# Patient Record
Sex: Female | Born: 1975 | Race: White | Hispanic: No
Health system: Southern US, Community
[De-identification: ages and names within clinical notes are randomized; demographics above are authoritative.]

## PROBLEM LIST (undated history)

## (undated) ENCOUNTER — Emergency Department: Admission: EM | Payer: Self-pay | Source: Home / Self Care

---

## 2012-09-30 ENCOUNTER — Emergency Department: Payer: Self-pay | Admitting: Emergency Medicine

## 2012-09-30 LAB — DRUG SCREEN, URINE
Barbiturates, Ur Screen: NEGATIVE (ref ?–200)
Cannabinoid 50 Ng, Ur ~~LOC~~: POSITIVE (ref ?–50)
Cocaine Metabolite,Ur ~~LOC~~: NEGATIVE (ref ?–300)
MDMA (Ecstasy)Ur Screen: NEGATIVE (ref ?–500)
Methadone, Ur Screen: NEGATIVE (ref ?–300)

## 2012-09-30 LAB — URINALYSIS, COMPLETE
Blood: NEGATIVE
Nitrite: NEGATIVE
Ph: 6 (ref 4.5–8.0)
Protein: 150
Specific Gravity: 1.02 (ref 1.003–1.030)
Squamous Epithelial: 8
WBC UR: 10 /HPF (ref 0–5)

## 2012-09-30 LAB — BASIC METABOLIC PANEL
Anion Gap: 10 (ref 7–16)
BUN: 16 mg/dL (ref 7–18)
Chloride: 105 mmol/L (ref 98–107)
Co2: 22 mmol/L (ref 21–32)
EGFR (African American): >60
Osmolality: 277 (ref 275–301)
Potassium: 3.2 mmol/L — ABNORMAL LOW (ref 3.5–5.1)
Sodium: 137 mmol/L (ref 136–145)

## 2012-09-30 LAB — CBC WITH DIFFERENTIAL/PLATELET
Basophil #: 0.2 10*3/uL — ABNORMAL HIGH (ref 0.0–0.1)
Eosinophil #: 0.3 10*3/uL (ref 0.0–0.7)
Eosinophil %: 2.5 %
HGB: 13.7 g/dL (ref 12.0–16.0)
Lymphocyte #: 3.8 10*3/uL — ABNORMAL HIGH (ref 1.0–3.6)
Lymphocyte %: 29.5 %
MCH: 31.8 pg (ref 26.0–34.0)
MCHC: 34.5 g/dL (ref 32.0–36.0)
MCV: 92 fL (ref 80–100)
Monocyte #: 0.9 x10 3/mm (ref 0.2–0.9)
RBC: 4.33 10*6/uL (ref 3.80–5.20)

## 2012-09-30 LAB — ETHANOL
Ethanol %: <0.003 % (ref 0.000–0.080)
Ethanol: <3 mg/dL

## 2013-05-22 ENCOUNTER — Emergency Department: Payer: Self-pay | Admitting: Emergency Medicine

## 2013-12-12 ENCOUNTER — Emergency Department: Payer: Self-pay | Admitting: Internal Medicine

## 2014-01-28 IMAGING — CT CT HEAD WITHOUT CONTRAST
1 series · 16 of 30 positions shown, 20 images · non-contrast
Comparison: none

REASON FOR EXAM: new onset seizure
COMMENTS:

PROCEDURE:     CT  - CT HEAD WITHOUT CONTRAST  - September 30, 2012  [DATE]
RESULT:     Comparison:  None
TECHNIQUE: Multiple axial images from the foramen magnum to the vertex were
obtained without IV contrast.

[Series 2: soft tissue · axial · 0.44mm/px · z∈[-23,+112]mm · 16 of 30 slices shown, 20 images]
[im 2/30  brain]
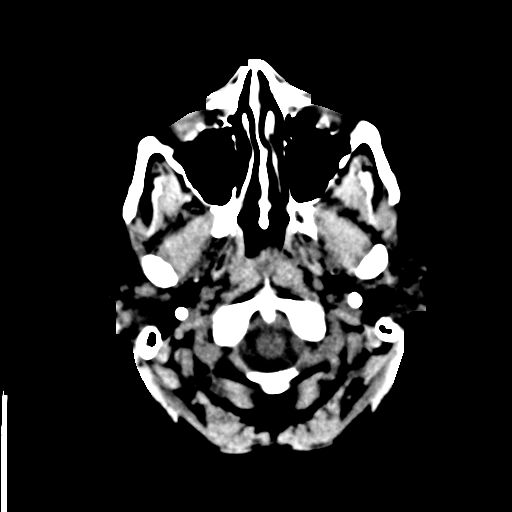
[im 2/30  bone]
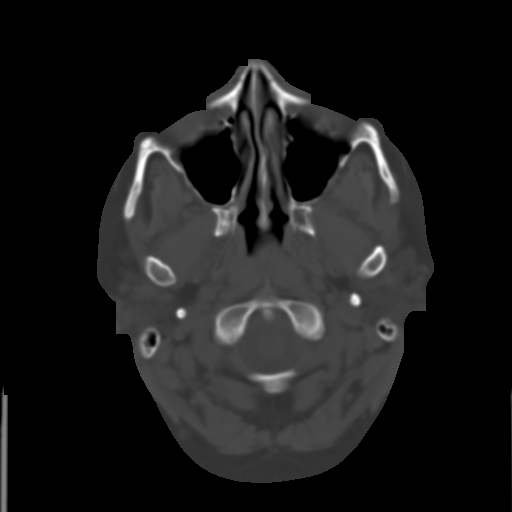
[im 4/30  brain]
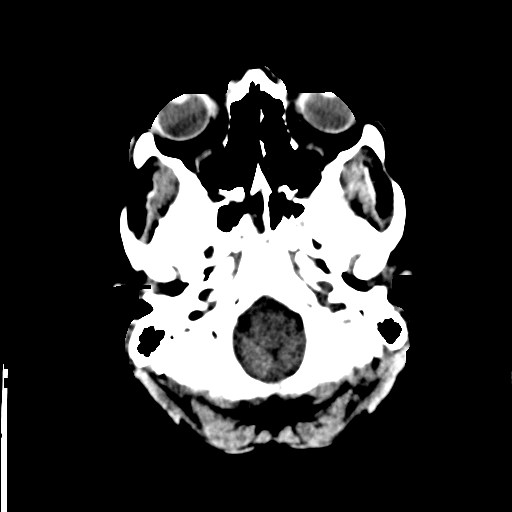
[im 6/30  brain]
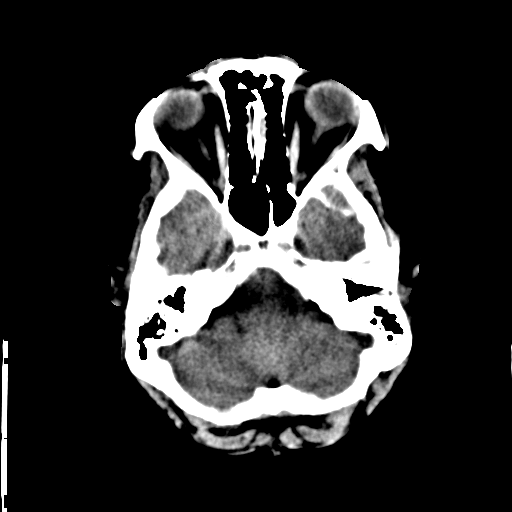
[im 8/30  brain]
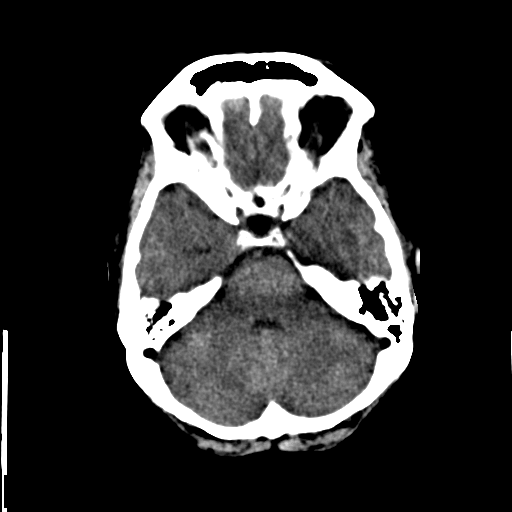
[im 9/30  brain]
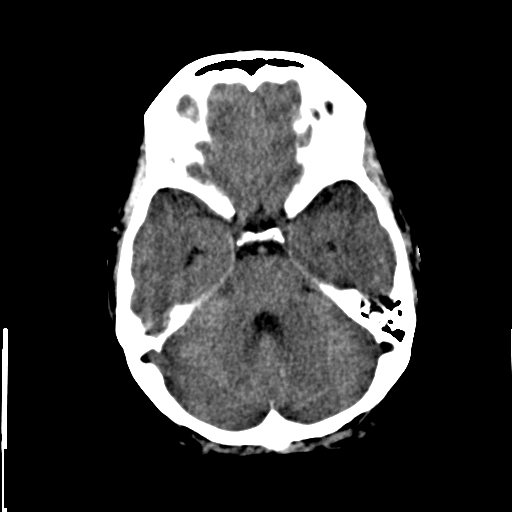
[im 9/30  bone]
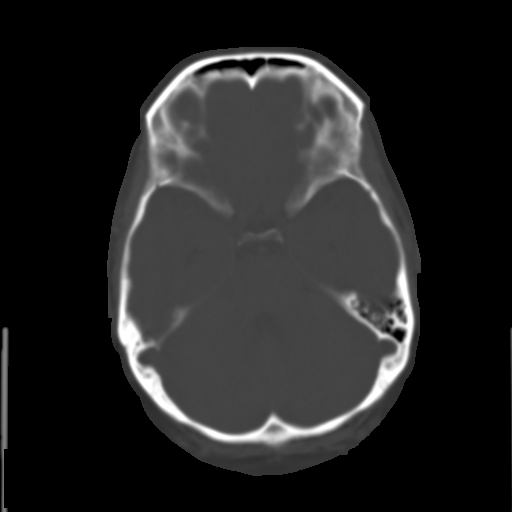
[im 11/30  brain]
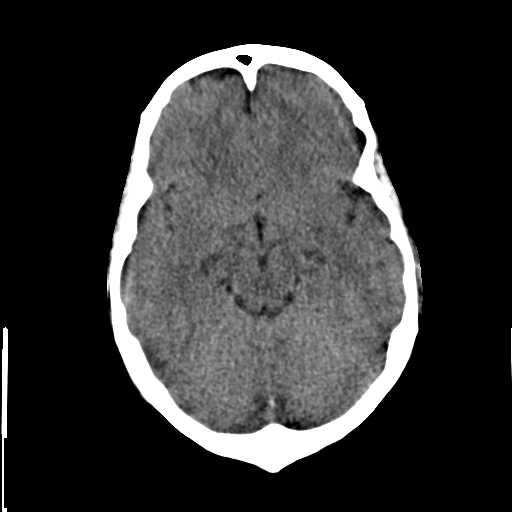
[im 13/30  brain]
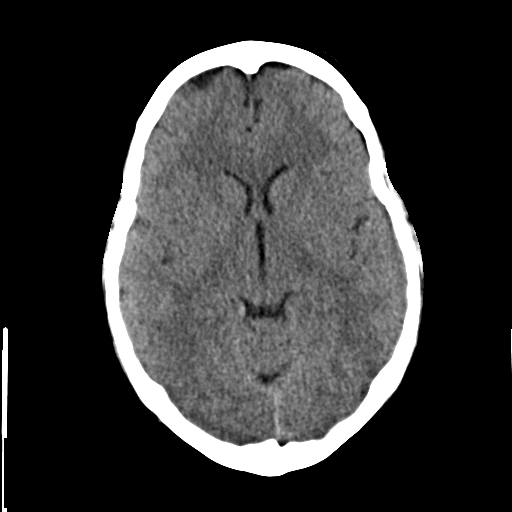
[im 15/30  brain]
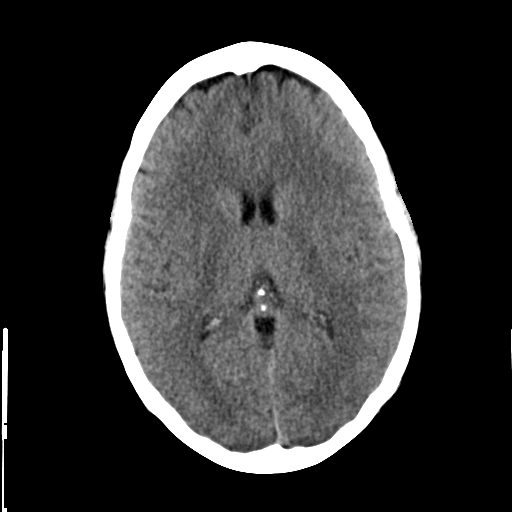
[im 16/30  brain]
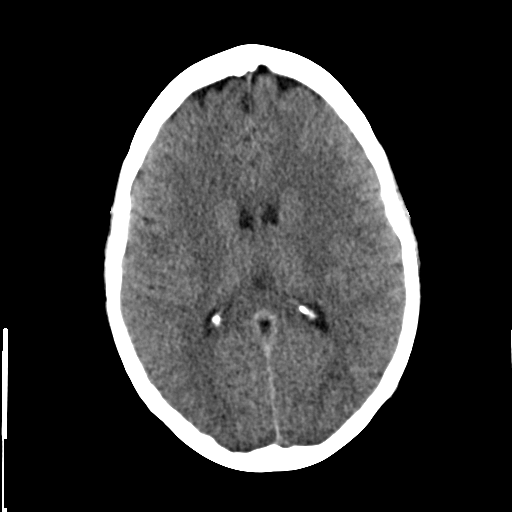
[im 16/30  bone]
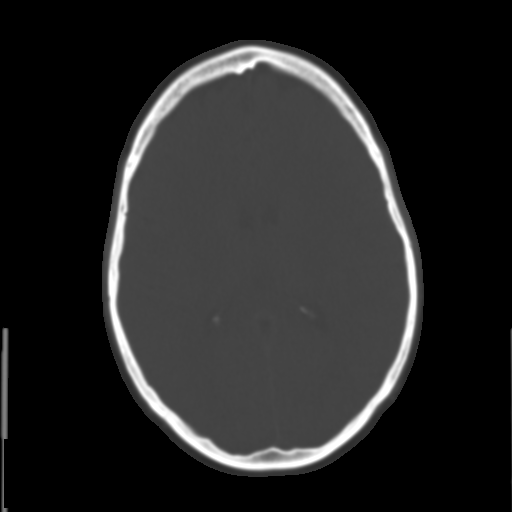
[im 18/30  brain]
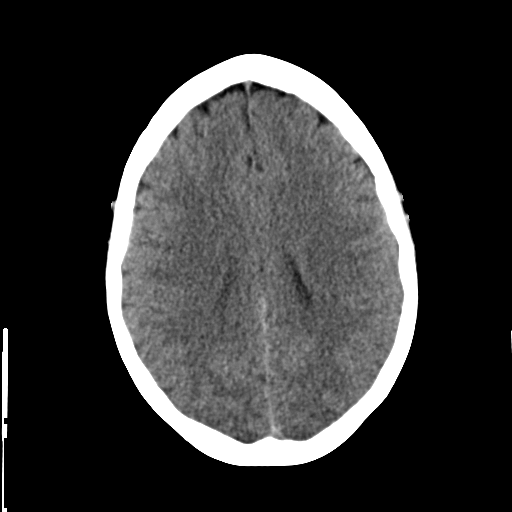
[im 20/30  brain]
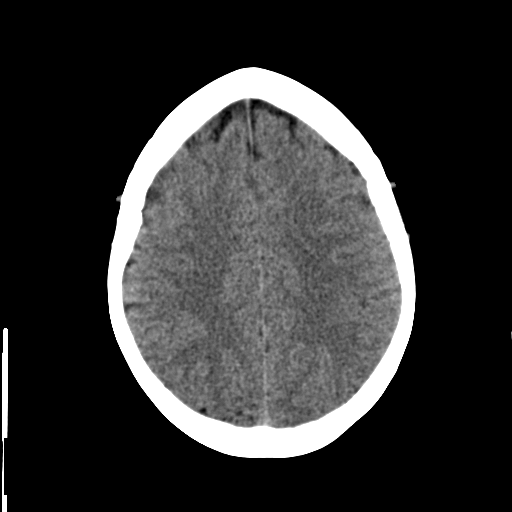
[im 22/30  brain]
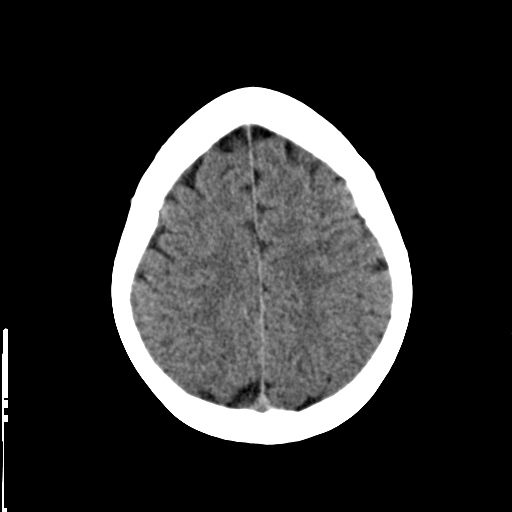
[im 23/30  brain]
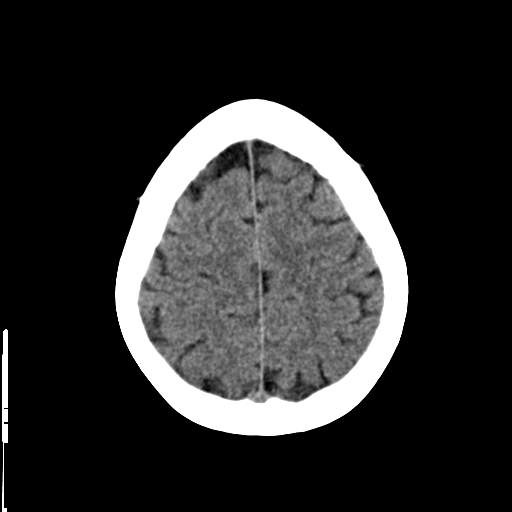
[im 23/30  bone]
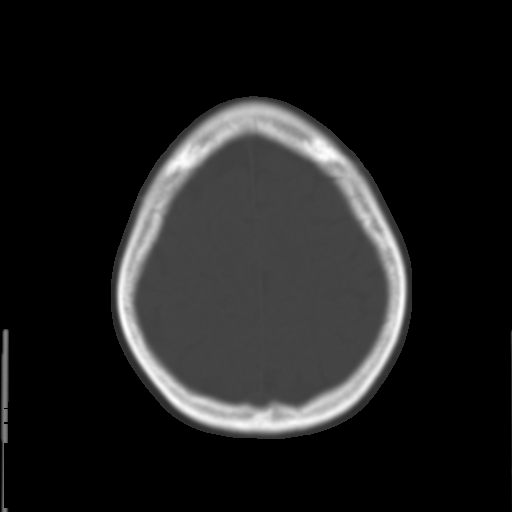
[im 25/30  brain]
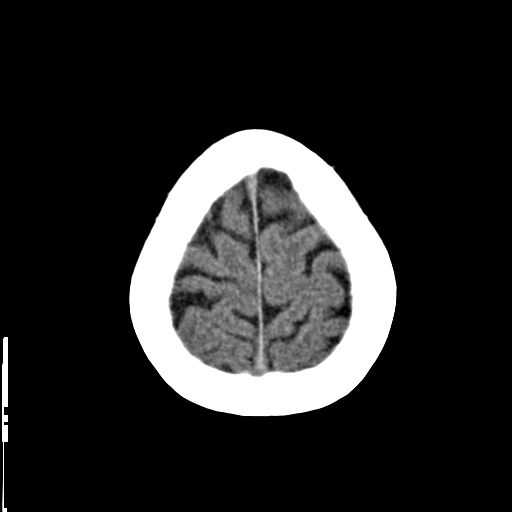
[im 27/30  brain]
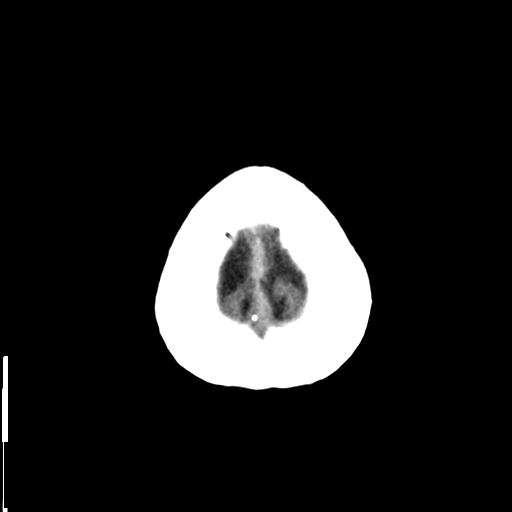
[im 29/30  brain]
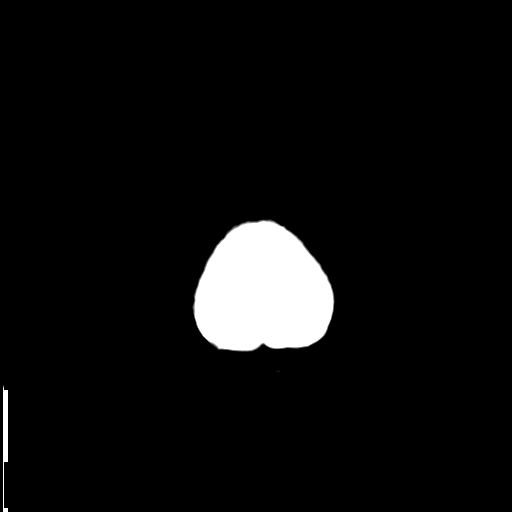

[16 of 30 positions shown; findings below may reference images not displayed]

FINDINGS: There is no evidence of mass effect, midline shift, or extra-axial fluid
collections.  There is no evidence of a space-occupying lesion or
intracranial hemorrhage. There is no evidence of a cortical-based area of
acute infarction.

The ventricles and sulci are appropriate for the patient's age. The basal
cisterns are patent.

Visualized portions of the orbits are unremarkable. The visualized portions
of the paranasal sinuses and mastoid air cells are unremarkable.

The osseous structures are unremarkable.
IMPRESSION: No acute intracranial process.

[REDACTED]

## 2015-02-23 IMAGING — CR DG CHEST 2V
1 series · 2 of 2 positions shown · non-contrast
Comparison: None.

CLINICAL DATA: Cough and wheezing, fever, smoker

EXAM:
CHEST  2 VIEW

[Series 1: w chest pa · 0.14mm/px · 2 of 2 slices shown]
[im 1/2]
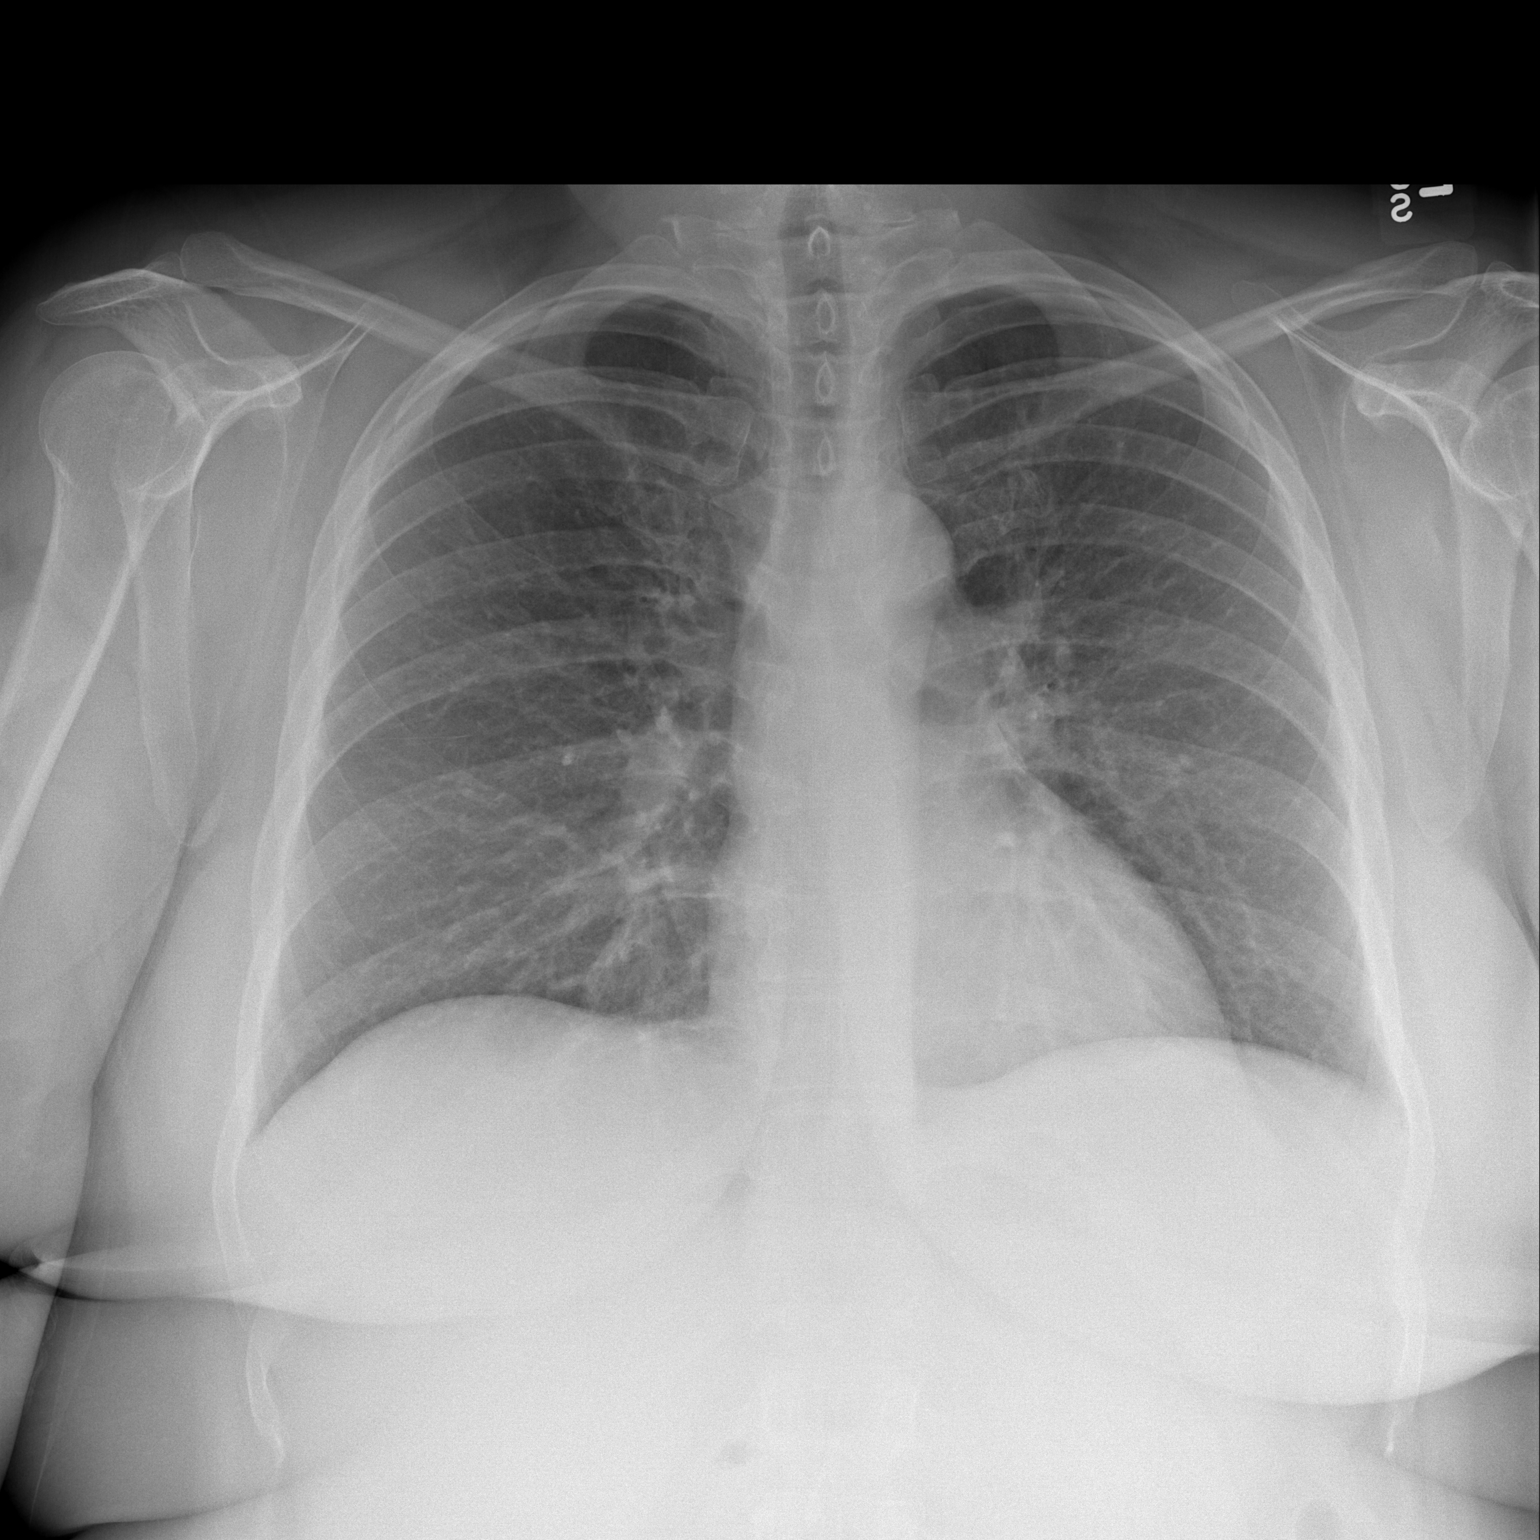
[im 2/2]
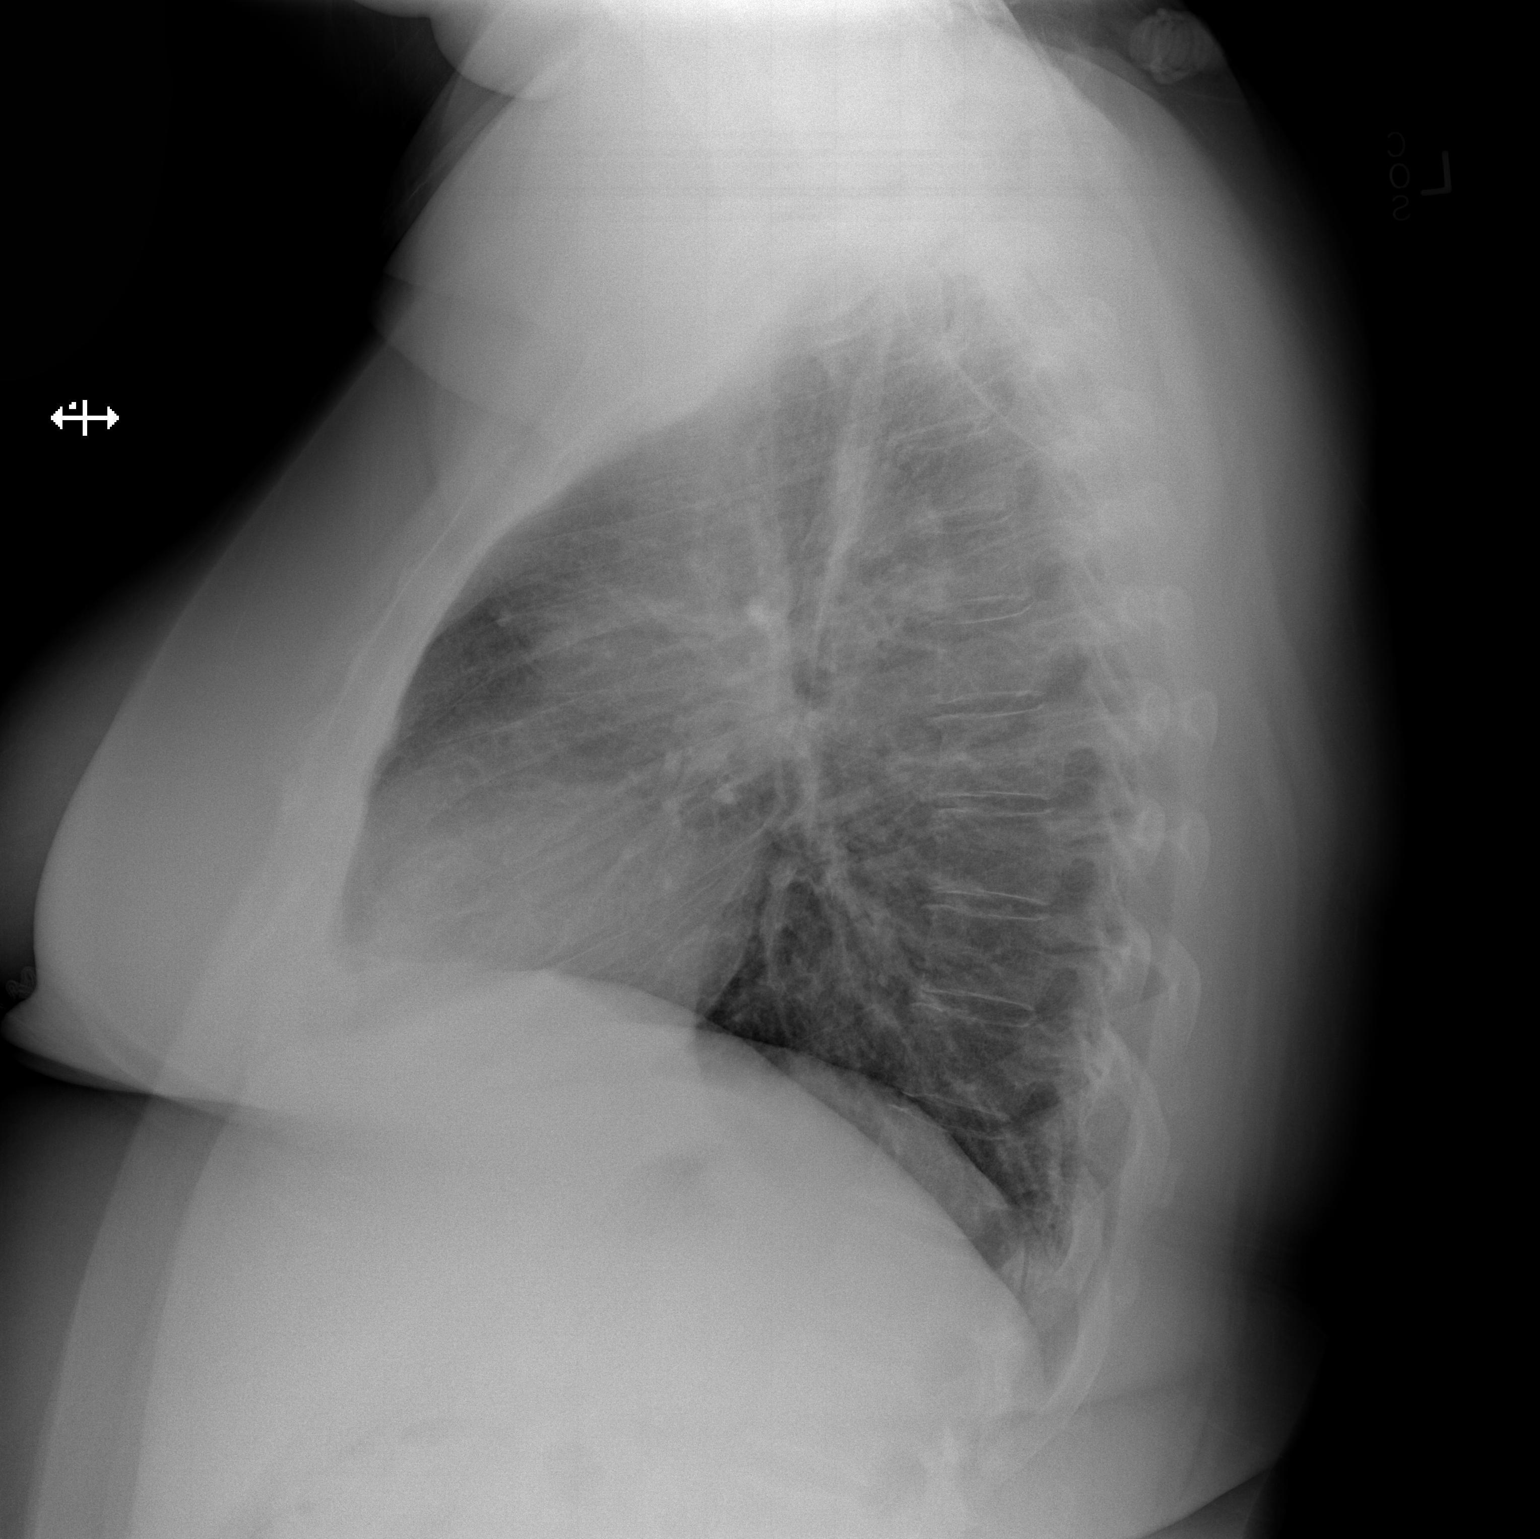

[2 of 2 positions shown; findings below may reference images not displayed]

FINDINGS: The heart size and mediastinal contours are within normal limits.
Both lungs are clear. The visualized skeletal structures are
unremarkable. Minor central bronchitic change noted. Trachea is
midline. No effusion or pneumothorax.
IMPRESSION: Minor bronchitic change.  No superimposed acute process

## 2017-09-21 ENCOUNTER — Other Ambulatory Visit
Admission: AD | Admit: 2017-09-21 | Discharge: 2017-09-21 | Disposition: A | Discharge status: Home / Self Care | Payer: No Typology Code available for payment source | Attending: Family Medicine | Admitting: Family Medicine

## 2022-02-10 DIAGNOSIS — L918 Other hypertrophic disorders of the skin: Secondary | ICD-10-CM | POA: Diagnosis not present

## 2022-02-10 DIAGNOSIS — J4521 Mild intermittent asthma with (acute) exacerbation: Secondary | ICD-10-CM | POA: Diagnosis not present

## 2022-02-10 DIAGNOSIS — Z1389 Encounter for screening for other disorder: Secondary | ICD-10-CM | POA: Diagnosis not present

## 2022-02-10 DIAGNOSIS — Z0000 Encounter for general adult medical examination without abnormal findings: Secondary | ICD-10-CM | POA: Diagnosis not present

## 2022-02-10 DIAGNOSIS — R7309 Other abnormal glucose: Secondary | ICD-10-CM | POA: Diagnosis not present

## 2022-02-10 DIAGNOSIS — F330 Major depressive disorder, recurrent, mild: Secondary | ICD-10-CM | POA: Diagnosis not present

## 2022-02-10 DIAGNOSIS — G4700 Insomnia, unspecified: Secondary | ICD-10-CM | POA: Diagnosis not present

## 2022-02-10 DIAGNOSIS — Z131 Encounter for screening for diabetes mellitus: Secondary | ICD-10-CM | POA: Diagnosis not present

## 2022-02-10 DIAGNOSIS — Z1331 Encounter for screening for depression: Secondary | ICD-10-CM | POA: Diagnosis not present

## 2022-02-10 DIAGNOSIS — Z13220 Encounter for screening for lipoid disorders: Secondary | ICD-10-CM | POA: Diagnosis not present

## 2022-02-16 DIAGNOSIS — J4521 Mild intermittent asthma with (acute) exacerbation: Secondary | ICD-10-CM | POA: Diagnosis not present

## 2022-02-16 DIAGNOSIS — R059 Cough, unspecified: Secondary | ICD-10-CM | POA: Diagnosis not present

## 2022-02-22 DIAGNOSIS — J4521 Mild intermittent asthma with (acute) exacerbation: Secondary | ICD-10-CM | POA: Diagnosis not present

## 2022-02-22 DIAGNOSIS — M545 Low back pain, unspecified: Secondary | ICD-10-CM | POA: Diagnosis not present

## 2022-02-22 DIAGNOSIS — R059 Cough, unspecified: Secondary | ICD-10-CM | POA: Diagnosis not present

## 2022-02-28 DIAGNOSIS — D485 Neoplasm of uncertain behavior of skin: Secondary | ICD-10-CM | POA: Diagnosis not present

## 2022-02-28 DIAGNOSIS — B079 Viral wart, unspecified: Secondary | ICD-10-CM | POA: Diagnosis not present

## 2022-02-28 DIAGNOSIS — Z1389 Encounter for screening for other disorder: Secondary | ICD-10-CM | POA: Diagnosis not present

## 2022-02-28 DIAGNOSIS — R238 Other skin changes: Secondary | ICD-10-CM | POA: Diagnosis not present

## 2022-03-01 DIAGNOSIS — J45909 Unspecified asthma, uncomplicated: Secondary | ICD-10-CM | POA: Diagnosis not present

## 2022-03-01 DIAGNOSIS — J4521 Mild intermittent asthma with (acute) exacerbation: Secondary | ICD-10-CM | POA: Diagnosis not present

## 2022-03-01 DIAGNOSIS — R059 Cough, unspecified: Secondary | ICD-10-CM | POA: Diagnosis not present

## 2022-03-04 DIAGNOSIS — G43009 Migraine without aura, not intractable, without status migrainosus: Secondary | ICD-10-CM | POA: Diagnosis not present

## 2022-03-04 DIAGNOSIS — E119 Type 2 diabetes mellitus without complications: Secondary | ICD-10-CM | POA: Diagnosis not present

## 2022-03-04 DIAGNOSIS — J4520 Mild intermittent asthma, uncomplicated: Secondary | ICD-10-CM | POA: Diagnosis not present

## 2022-03-04 DIAGNOSIS — R600 Localized edema: Secondary | ICD-10-CM | POA: Diagnosis not present

## 2022-03-04 DIAGNOSIS — M545 Low back pain, unspecified: Secondary | ICD-10-CM | POA: Diagnosis not present

## 2022-03-04 DIAGNOSIS — F321 Major depressive disorder, single episode, moderate: Secondary | ICD-10-CM | POA: Diagnosis not present

## 2022-03-04 DIAGNOSIS — N6313 Unspecified lump in the right breast, lower outer quadrant: Secondary | ICD-10-CM | POA: Diagnosis not present

## 2022-03-04 DIAGNOSIS — Z23 Encounter for immunization: Secondary | ICD-10-CM | POA: Diagnosis not present

## 2022-03-04 DIAGNOSIS — Z131 Encounter for screening for diabetes mellitus: Secondary | ICD-10-CM | POA: Diagnosis not present

## 2022-03-04 DIAGNOSIS — Z1159 Encounter for screening for other viral diseases: Secondary | ICD-10-CM | POA: Diagnosis not present

## 2022-03-08 DIAGNOSIS — R600 Localized edema: Secondary | ICD-10-CM | POA: Diagnosis not present

## 2022-03-16 DIAGNOSIS — Z124 Encounter for screening for malignant neoplasm of cervix: Secondary | ICD-10-CM | POA: Diagnosis not present

## 2022-03-18 DIAGNOSIS — N6313 Unspecified lump in the right breast, lower outer quadrant: Secondary | ICD-10-CM | POA: Diagnosis not present

## 2022-03-24 DIAGNOSIS — M5416 Radiculopathy, lumbar region: Secondary | ICD-10-CM | POA: Diagnosis not present

## 2022-03-24 DIAGNOSIS — Z6841 Body Mass Index (BMI) 40.0 and over, adult: Secondary | ICD-10-CM | POA: Diagnosis not present

## 2022-04-11 DIAGNOSIS — E782 Mixed hyperlipidemia: Secondary | ICD-10-CM | POA: Diagnosis not present

## 2022-04-11 DIAGNOSIS — M545 Low back pain, unspecified: Secondary | ICD-10-CM | POA: Diagnosis not present

## 2022-04-11 DIAGNOSIS — F419 Anxiety disorder, unspecified: Secondary | ICD-10-CM | POA: Diagnosis not present

## 2022-04-11 DIAGNOSIS — F332 Major depressive disorder, recurrent severe without psychotic features: Secondary | ICD-10-CM | POA: Diagnosis not present

## 2022-04-11 DIAGNOSIS — G43009 Migraine without aura, not intractable, without status migrainosus: Secondary | ICD-10-CM | POA: Diagnosis not present

## 2022-04-11 DIAGNOSIS — N6313 Unspecified lump in the right breast, lower outer quadrant: Secondary | ICD-10-CM | POA: Diagnosis not present

## 2022-04-11 DIAGNOSIS — E119 Type 2 diabetes mellitus without complications: Secondary | ICD-10-CM | POA: Diagnosis not present

## 2022-04-11 DIAGNOSIS — R600 Localized edema: Secondary | ICD-10-CM | POA: Diagnosis not present

## 2022-05-18 NOTE — Therapy (Signed)
OUTPATIENT PHYSICAL THERAPY THORACOLUMBAR EVALUATION   Patient Name: ALESCIA ROWBOTHAM MRN: [id number] DOB:22-Jul-1975, 47 y.o., female Today's Date: 05/18/2022  END OF SESSION:   No past medical history on file. *** The histories are not reviewed yet. Please review them in the "History" navigator section and refresh this SmartLink. There are no problems to display for this patient.  PCP: Patient, No Pcp Per  REFERRING PROVIDER: Sherryl Manges*  REFERRING DIAG: M54.16 (ICD-10-CM) - Radiculopathy, lumbar region  RATIONALE FOR EVALUATION AND TREATMENT: Rehabilitation  THERAPY DIAG: Other low back pain  ONSET DATE: ***  FOLLOW-UP APPT SCHEDULED WITH REFERRING PROVIDER: {yes/no:20286}    SUBJECTIVE:                                                                                                                                                                                         SUBJECTIVE STATEMENT:  ***  PERTINENT HISTORY:  47 year old patient comes in today discuss her back pain and left leg pain that started about 3 years ago. It has progressively gotten worse. She did see a physician at [facility] who ordered x-rays an MRI. She was told she was too obese to have surgery. She cannot sit or stand for too long. She denies any numbness or tingling PE. The pain radiates down the posterior aspect of her left leg. She has tried physical therapy a couple years ago which did not help with her pain. Denies any change in her bowel or bladder habits. Rates her pain today an 8/10, constant, achy pretty  PAIN:    Pain Intensity: Present: /10, Best: /10, Worst: /10 Pain location: *** Pain Quality: {PAIN DESCRIPTION:21022940}  Radiating: {yes/no:20286}  Numbness/Tingling: {yes/no:20286} Focal Weakness: {yes/no:20286} Aggravating factors: *** Relieving factors: *** 24-hour pain behavior: *** How long can you sit: How long can you stand: History of prior back injury, pain,  surgery, or therapy: {yes/no:20286} Dominant hand: {RIGHT/LEFT:20294} Imaging: {yes/no:20286}  Red flags: Negative for bowel/bladder changes, saddle paresthesia, personal history of cancer, h/o spinal tumors, h/o compression fx, h/o abdominal aneurysm, abdominal pain, chills/fever, night sweats, nausea, vomiting, unrelenting pain, first onset of insidious LBP <20 y/o  PRECAUTIONS: {Therapy precautions:24002}  WEIGHT BEARING RESTRICTIONS: {Yes ***/No:24003}  FALLS: Has patient fallen in last 6 months? {fallsyesno:27318}  Living Environment Lives with: {[facility] lives with:25569::"lives with their family"} Lives in: {Lives in:25570} Stairs: {opstairs:27293} Has following equipment at home: {Assistive devices:23999}  Prior level of function: {PLOF:24004}  Occupational demands:   Hobbies:   Patient Goals: ***   OBJECTIVE:  Patient Surveys  {rehab surveys:24030}  Cognition Patient is oriented to person, place, and time.  Recent memory is intact.  Remote memory is intact.  Attention span and concentration are intact.  Expressive speech is intact.  Patient's fund of knowledge is within normal limits for educational level.    Gross Musculoskeletal Assessment Tremor: None Bulk: Normal Tone: Normal No visible step-off along spinal column, no signs of scoliosis  GAIT: Distance walked: *** Assistive device utilized: {Assistive devices:23999} Level of assistance: {Levels of assistance:24026} Comments: ***  Posture: Lumbar lordosis: WNL Iliac crest height: Equal bilaterally Lumbar lateral shift: Negative  AROM AROM (Normal range in degrees) AROM   Lumbar   Flexion (65)   Extension (30)   Right lateral flexion (25)   Left lateral flexion (25)   Right rotation (30)   Left rotation (30)       Hip Right Left  Flexion (125)    Extension (15)    Abduction (40)    Adduction     Internal Rotation (45)    External Rotation (45)        Knee    Flexion (135)     Extension (0)        Ankle    Dorsiflexion (20)    Plantarflexion (50)    Inversion (35)    Eversion (15)    (* = pain; Blank rows = not tested)  LE MMT: MMT (out of 5) Right  Left   Hip flexion    Hip extension    Hip abduction    Hip adduction    Hip internal rotation    Hip external rotation    Knee flexion    Knee extension    Ankle dorsiflexion    Ankle plantarflexion    Ankle inversion    Ankle eversion    (* = pain; Blank rows = not tested)  Sensation Grossly intact to light touch throughout bilateral LEs as determined by testing dermatomes L2-S2. Proprioception, stereognosis, and hot/cold testing deferred on this date.  Reflexes R/L Knee Jerk (L3/4): 2+/2+  Ankle Jerk (S1/2): 2+/2+   Muscle Length Hamstrings: R: {NEGATIVE/POSITIVE ZOX:09604} L: {NEGATIVE/POSITIVE VWU:98119} Ely (quadriceps): R: {NEGATIVE/POSITIVE JYN:82956} L: {NEGATIVE/POSITIVE OZH:08657} Thomas (hip flexors): R: {NEGATIVE/POSITIVE QIO:96295} L: {NEGATIVE/POSITIVE MWU:13244} Ober: R: {NEGATIVE/POSITIVE WNU:27253} L: {NEGATIVE/POSITIVE GUY:40347}  Palpation Location Right Left         Lumbar paraspinals    Quadratus Lumborum    Gluteus Maximus    Gluteus Medius    Deep hip external rotators    PSIS    Fortin's Area (SIJ)    Greater Trochanter    (Blank rows = not tested) Graded on 0-4 scale (0 = no pain, 1 = pain, 2 = pain with wincing/grimacing/flinching, 3 = pain with withdrawal, 4 = unwilling to allow palpation)  Passive Accessory Intervertebral Motion Pt denies reproduction of back pain with CPA L1-L5 and UPA bilaterally L1-L5. Generally, hypomobile throughout  Special Tests Lumbar Radiculopathy and Discogenic: Centralization and Peripheralization (SN 92, -LR 0.12): {NEGATIVE/POSITIVE FOR:19998} Slump (SN 83, -LR 0.32): R: {NEGATIVE/POSITIVE FOR:19998} L: {NEGATIVE/POSITIVE FOR:19998} SLR (SN 92, -LR 0.29): R: {NEGATIVE/POSITIVE QQV:95638} L:  {NEGATIVE/POSITIVE  VFI:43329} Crossed SLR (SP 90): R: {NEGATIVE/POSITIVE JJO:84166} L: {NEGATIVE/POSITIVE AYT:01601}  Facet Joint: Extension-Rotation (SN 100, -LR 0.0): R: {NEGATIVE/POSITIVE UXN:23557} L: {NEGATIVE/POSITIVE DUK:02542}  Lumbar Foraminal Stenosis: Lumbar quadrant (SN 70): R: {NEGATIVE/POSITIVE HCW:23762} L: {NEGATIVE/POSITIVE GBT:51761}  Hip: FABER (SN 81): R: {NEGATIVE/POSITIVE YWV:37106} L: {NEGATIVE/POSITIVE YIR:48546} FADIR (SN 94): R: {NEGATIVE/POSITIVE EVO:35009} L: {NEGATIVE/POSITIVE FGH:82993} Hip scour (SN 50): R: {NEGATIVE/POSITIVE ZJI:96789} L: {NEGATIVE/POSITIVE FYB:01751}  SIJ:  Thigh Thrust (SN 88, -LR 0.18) :  R: {NEGATIVE/POSITIVE XLK:44010} L: {NEGATIVE/POSITIVE UVO:53664}  Piriformis Syndrome: FAIR Test (SN 88, SP 83): R: {NEGATIVE/POSITIVE QIH:47425} L: {NEGATIVE/POSITIVE ZDG:38756}  Functional Tasks Lifting: Deep squat: Sit to stand: Forward Step-Down Test: R:  L:  Lateral Step-Down Test: R:  L:   Beighton scale  LEFT  RIGHT           1. Passive dorsiflexion and hyperextension of the fifth MCP joint beyond 90  0 0   2. Passive apposition of the thumb to the flexor aspect of the forearm  0  0   3. Passive hyperextension of the elbow beyond 10  0  0   4. Passive hyperextension of the knee beyond 10  0  0   5. Active forward flexion of the trunk with the knees fully extended so that the palms of the hands rest flat on the floor   0   TOTAL         0/ 9    Clinical Prediction Rule for Compression Fractures: 1. >52 y/o 2. no presence of leg pain 3. BMI <22 4. client not regularly exercising 5. female <1 of 5 = SN 97, - LR 0.16 >4 of 5 = SP 96, + LR 9.6  Clinical Prediction Rule for Spinal Stenosis: 1. Bilateral symptoms 2. Leg pain > back pain 3. Pain during walking or standing 4. Pain relief when sitting 5. Age >61 y/o <1 (+) finding = rule out stenosis (SN 96) 4 of 5 (+) findings = rule in stenosis (SP 98)  Clinical Prediction Rule for Facet Joint  Syndrome (check pg 460): 1. Age > 14 y/o 2. Symptoms best when walking 3. Symptoms best when sitting 4. Onset of pain is paraspinal 5. (+) lumbar extension-rotation test >3 (+) findings = SP 91, +LR 9.7 <2 (+) findings = SN 100  Pain Provocation Cluster for SIJ Dysfunction Thigh Thrust Test (SN 88, -LR 0.18) Gaenslen's Test Distraction Test Compression Test Sacral Thrust Test  3 (+) tests: if discogenic pain has been ruled out through repeated extensions; SN 94, -LR 0.80  Rule out Hip OA (SN 86) Hip pain Hip IR <15 degrees morning stiffness < 60 min > 50 y/o  Neurogenic vs Vascular Claudication (Rieman, 2016)  Description Neurogenic Vascular  Quality of pain cramping Burning, cramping  LBP Frequently present Absent   Sensory symptoms Frequently present Absent  Muscle weakness Frequently present Absent  Reflex changes Frequently present Absent  Bicycle test Symptoms only when sitting upright Symptoms not position dependent  Arterial pulses Normal  Decreased or absent  Skin/dystrophic changes Absent  Frequently present  Aggravating factors Upright posture, extension of trunk Any leg activity  Relieving factors Sitting, bending forward Rest, no leg activity  Walking uphill Symptoms produced later Not position dependent  Walking downhill Symptoms produced earlier Not position dependent   Lumbar Spinal Stenosis vs Radiculopathy due to Disc Herniation (Rieman, 2016)  Description Spinal Stenosis Disc Herniation  Age Usually > 75 y/o Usually < 21 y/o  Onset Usually more insidious Usually more sudden  Position change: flexion Better  Worse  Position change: extension Worse Better  Focal muscle weakness Less common Can be common  Dural tension Less common Common  UMN or LMN involvement  Central stenosis: UMN Lateral foraminal stenosis: LMN LMN     TODAY'S TREATMENT: DATE: ***     PATIENT EDUCATION:  Education details: *** Person educated: {Person  educated:25204} Education method: {Education Method:25205} Education comprehension: {Education Comprehension:25206}   HOME EXERCISE PROGRAM:  ASSESSMENT:  CLINICAL IMPRESSION: Patient is a *** y.o. *** who was seen today for physical therapy evaluation and treatment for ***.   OBJECTIVE IMPAIRMENTS: {opptimpairments:25111}.   ACTIVITY LIMITATIONS: {activitylimitations:27494}  PARTICIPATION LIMITATIONS: {participationrestrictions:25113}  PERSONAL FACTORS: {Personal factors:25162} are also affecting patient's functional outcome.   REHAB POTENTIAL: {rehabpotential:25112}  CLINICAL DECISION MAKING: {clinical decision making:25114}  EVALUATION COMPLEXITY: {Evaluation complexity:25115}   GOALS: Goals reviewed with patient? {yes/no:20286}  SHORT TERM GOALS: Target date: {follow up:25551}  Pt will be independent with HEP in order to improve strength and decrease back pain to improve pain-free function at home and work. Baseline: *** Goal status: INITIAL   LONG TERM GOALS: Target date: {follow up:25551}  Pt will increase FOTO to at least *** to demonstrate significant improvement in function at home and work related to back pain  Baseline:  Goal status: INITIAL  2.  Pt will decrease worst back pain by at least 2 points on the NPRS in order to demonstrate clinically significant reduction in back pain. Baseline: *** Goal status: INITIAL  3.  Pt will decrease mODI score by at least 13 points in order demonstrate clinically significant reduction in back pain/disability.       Baseline: *** Goal status: INITIAL  4.  *** Baseline: *** Goal status: INITIAL   PLAN: PT FREQUENCY: 1-2x/week  PT DURATION: {rehab duration:25117}  PLANNED INTERVENTIONS: Therapeutic exercises, Therapeutic activity, Neuromuscular re-education, Balance training, Gait training, Patient/Family education, Self Care, Joint mobilization, Joint manipulation, Vestibular training, Canalith  repositioning, Orthotic/Fit training, DME instructions, Dry Needling, Electrical stimulation, Spinal manipulation, Spinal mobilization, Cryotherapy, Moist heat, Taping, Traction, Ultrasound, Ionotophoresis 4mg /ml Dexamethasone, Manual therapy, and Re-evaluation.  PLAN FOR NEXT SESSION: ***   Sharalyn Ink Chelle Cayton PT, DPT, GCS  Carrol Hougland, PT 05/18/2022, 9:53 PM

## 2022-05-19 ENCOUNTER — Telehealth: Payer: Self-pay

## 2022-05-19 ENCOUNTER — Ambulatory Visit: Payer: Medicaid Other | Attending: Student

## 2022-05-19 DIAGNOSIS — M5442 Lumbago with sciatica, left side: Secondary | ICD-10-CM | POA: Insufficient documentation

## 2022-05-19 DIAGNOSIS — G8929 Other chronic pain: Secondary | ICD-10-CM | POA: Diagnosis not present

## 2022-05-19 DIAGNOSIS — M5459 Other low back pain: Secondary | ICD-10-CM

## 2022-05-19 NOTE — Telephone Encounter (Signed)
..   Medicaid Managed Care   Unsuccessful Outreach Note  05/19/2022 Name: Andrea Rocha MRN: [id number] DOB: 1975-01-19  Referred by: Patient, No Pcp Per Reason for referral : Appointment   An unsuccessful telephone outreach was attempted today. The patient was referred to the case management team for assistance with care management and care coordination.   Follow Up Plan: A HIPAA compliant phone message was left for the patient providing contact information and requesting a return call.  The care management team will reach out to the patient again over the next 7-14 days.    Weston Settle Care Guide  [facility] Managed  Care Guide [facility]  [phone]

## 2022-05-24 NOTE — Therapy (Signed)
OUTPATIENT PHYSICAL THERAPY LUMBAR TREATMENT   Andrea Rocha Name: Andrea Rocha MRN: [id number] DOB:Nov 02, 1975, 47 y.o., female Today's Date: 05/25/2022  END OF SESSION:  Andrea Rocha End of Session - 05/25/22 0845     Visit Number 2    Number of Visits 17    Date for Andrea Rocha Re-Evaluation 07/14/22    Authorization Type eval: 05/19/22    Andrea Rocha Start Time 0845    Andrea Rocha Stop Time 0930    Andrea Rocha Time Calculation (min) 45 min    Activity Tolerance Andrea Rocha tolerated treatment well    Behavior During Therapy [facility] for tasks assessed/performed            History reviewed. No pertinent past medical history. History reviewed. No pertinent surgical history. There are no problems to display for this Andrea Rocha.  PCP: Andrea Rocha  REFERRING PROVIDER: Sherryl Manges*  REFERRING DIAG: M54.16 (ICD-10-CM) - Radiculopathy, lumbar region  RATIONALE FOR EVALUATION AND TREATMENT: Rehabilitation  THERAPY DIAG: Chronic left-sided low back pain with left-sided sciatica  ONSET DATE: 05/19/2019 (approximate);  FOLLOW-UP APPT SCHEDULED WITH REFERRING PROVIDER: Yes   FROM INITIAL EVALUATION SUBJECTIVE:                                                                                                                                                                                         SUBJECTIVE STATEMENT:  L low back pain  PERTINENT HISTORY:  Andrea Rocha is a 47 year old female referred to physical therapy for left low back pain and with LLE radiculopathy which started about 3 years ago and has progressively worsened. She did see a physician at [facility] who ordered x-rays an MRI. Rocha Andrea Rocha MRI showed spinal spinal stenosis and disc bulge however images/report are not available to review at this time. She was told she was too obese to fit on the surgical frame and was not a surgical candidate. She was unhappy with the doctor and transferred to [facility] She reports that the doctor there told her that she needed  to lose weight in order for her back pain to improve. She has since switched to a separate neurosurgeon at [facility] who wanted to start the process over by initiating physical therapy and obtaining new imaging. The pain radiates down the posterior aspect of her left leg into the thigh but no further. She tried physical therapy a couple years ago which did not help with her pain however she does report some benefit with morning stretches. She has also had spinal injections without benefit. Andrea Rocha reports that she has has back pain since she was 17 when she bruised her tailbone. 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 Her back pain could not tolerate being in one location for that long. Pain is primarily worsened with extended standing and walking. She reports intermittent L calf and ankle swelling and they have yet to find an etiology. No personal history of cancer or lymphedema. Denies history of CHF. Andrea Rocha lives close to [facility] and has talked to her husband about starting a walking program. Andrea Rocha has an upcoming appointment with dietetics and she is hoping to implement some healthier eating habits in the home. Andrea Rocha and husband are both smokers and are trying to quit. She is using patches and is on day 4 of a new Chantix prescription. She does report significant stressors in the home including family discord and concerns for her husband regarding recent unexplained weight loss. She has a history of History of scoliosis, obesity, anxiety, MDD, PTSD, migraines, and DMII (recent A1C 6.7).  PAIN:    Pain Intensity: Present: 4/10, Best: 4/10, Worst: 8/10 Pain location: Left low back Pain Quality: stabbing  Radiating: Yes into L buttock and posterior L thigh Numbness/Tingling:  Yes, in the L thigh and calf (L ankle and calf swelling, no DVT).  Focal Weakness: Yes, buckled once Aggravating factors: extended standing, walking (100' before stopping due to pain and DOE), ascending stairs; Relieving factors: changing positions, sitting after extended standing, topical muscle rub (Tiger Balm and Salonpas), heat makes it hurt worse unless her muscles are very tight and then she uses it sparingly.   24-hour pain behavior: activity dependent and usually worsens as day progresses How long can you sit: unlimited How long can you stand: 5 minutes History of prior back injury, pain, surgery, or therapy: Yes, back injury at 17 but no history of surgery Dominant hand: right Imaging: Yes, not available for review  PRECAUTIONS: None  WEIGHT BEARING RESTRICTIONS: No  FALLS: Has Andrea Rocha fallen in last 6 months? Yes. Number of falls 1, Andrea Rocha reports LOC  Living Environment Lives with: lives with their spouse Lives in: Mobile home Stairs: Yes: External: 4 steps; can reach both rails Has following equipment at home: Single point cane tub shower  Prior level of function: Independent  Occupational demands: Not currently working, used to work at the [employer]: [place], [employer], [employer], "I hate being in public now."   Andrea Rocha Goals: "I would like to be able to turn my upper body and not get a cramp. I would like to be able to go to the grocery store." Andrea Rocha would like to be able to move easier so she can play with her granddaughter.    OBJECTIVE:  Andrea Rocha Surveys  FOTO 44, predicted improvement to 2; mODI: To be completed  Cognition Andrea Rocha is oriented to person, place, and time.  Recent memory is intact.  Remote memory is intact.  Attention span and concentration are intact.  Expressive speech is intact.  Andrea Rocha's fund of knowledge is within normal limits for educational level.    Gross Musculoskeletal Assessment Tremor: None Bulk:  Normal Tone: Normal No visible step-off along spinal column, no signs of scoliosis  GAIT: Distance walked: 100' Assistive device utilized: None Level of assistance: Complete Independence Comments: No gross deficits in gait noted. Full gait assessment deferred  Posture: Lumbar lordosis: WNL Iliac crest height: Equal bilaterally Lumbar lateral shift: Negative  AROM AROM (Normal range in degrees) AROM   Lumbar   Flexion (65) Mod loss of segmental mobility*  Extension (30) Mild loss of segmental mobility*  Right lateral flexion (25) Mod loss  Left lateral flexion (25) Mod loss  Right rotation (30) Mod loss  Left rotation (30) Mod loss      Hip Right Left  Flexion (125)    Extension (15)    Abduction (40)    Adduction     Internal Rotation (45)    External Rotation (45)        Knee    Flexion (135)    Extension (0)        Ankle    Dorsiflexion (20)    Plantarflexion (50)    Inversion (35)    Eversion (15)    (* = pain; Blank rows = not tested)  LE MMT: MMT (out of 5) Right  Left   Hip flexion 5 5  Hip extension    Hip abduction    Hip adduction    Hip internal rotation 5 5  Hip external rotation 5 5  Knee flexion (seated) 5 5  Knee extension 5 5  Ankle dorsiflexion 5 5  Ankle plantarflexion    Ankle inversion    Ankle eversion    (* = pain; Blank rows = not tested)  Sensation Grossly intact to light touch throughout bilateral LEs as determined by testing dermatomes L2-S2. Proprioception, stereognosis, and hot/cold testing deferred on this date.  Muscle Length Hamstrings: R: Not examined L: Not examined Ely (quadriceps): R: Not examined L: Not examined Thomas (hip flexors): R: Not examined L: Not examined Ober: R: Not examined L: Not examined  Palpation Location Right Left         Lumbar paraspinals    Quadratus Lumborum    Gluteus Maximus    Gluteus Medius    Deep hip external rotators    PSIS    Fortin's Area (SIJ)    Greater Trochanter     (Blank rows = not tested) Graded on 0-4 scale (0 = no pain, 1 = pain, 2 = pain with wincing/grimacing/flinching, 3 = pain with withdrawal, 4 = unwilling to allow palpation)  Passive Accessory Intervertebral Motion Deferred  Special Tests Lumbar Radiculopathy and Discogenic: Centralization and Peripheralization (SN 92, -LR 0.12): Not examined Slump (SN 83, -LR 0.32): R: Not examined L: Not examined SLR (SN 92, -LR 0.29): R: Not examined L:  Not examined Crossed SLR (SP 90): R: Not examined L: Not examined  Facet Joint: Extension-Rotation (SN 100, -LR 0.0): R: Not examined L: Not examined  Lumbar Foraminal Stenosis: Lumbar quadrant (SN 70): R: Not examined L: Not examined  Hip: FABER (SN 81): R: Not examined L: Not examined FADIR (SN 94): R: Not examined L: Not examined Hip scour (SN 50): R: Not examined L: Not examined  SIJ:  Thigh Thrust (SN 88, -LR 0.18) : R: Not examined L: Not examined  Piriformis Syndrome: FAIR Test (SN 88, SP 83): R: Not examined L: Not examined  Functional Tasks Deferred  Beighton scale: Deferred   TODAY'S TREATMENT:   SUBJECTIVE: Andrea Rocha reports that she is doing alright today. No changes since the initial evaluation. Denies resting lumbar pain upon arrival.   PAIN: Denies   Ther-ex  NuStep L0 (seat 9, arms 9) x 5 minutes for warm-up during interval history and while Andrea Rocha completes mODI (56%);  Hamstring length: R: 90 degrees  L: 90 degrees  LE MMT: MMT (out of 5) Right  Left   Hip extension 3+ 3+  Hip abduction 4+ 4+  Hip adduction 4+ 4+   Special Tests Lumbar Radiculopathy and Discogenic: Centralization and Peripheralization (SN 92, -LR 0.12): Not examined Slump (SN 83, -LR 0.32): R: Not examined L: Not examined SLR (SN 92, -LR 0.29): R: Negative L:  Negative Crossed SLR (SP 90): R: Negative L: Negative  Facet Joint: Extension-Rotation (SN 100, -LR 0.0): R: Not examined L: Not examined  Lumbar Foraminal Stenosis: Lumbar  quadrant (SN 70): R: Not examined L: Not examined  Hip: FABER (SN 81): R: Negative L: Negative FADIR (SN 94): R: Negative L: Negative Hip scour (SN 50): R: Negative L: Negative Figure 4: R: Negative L: Positive for low back pain  PAIVM: Unable to tolerate so mobility unable to be assesed;  Palpation:  Tenderness to palpation to lumbar paraspinal bilaterally in lower lumber segments (L3-L5) and over sacrum.  Hooklying lumbar rotation rocking 2 x 60s; Hooklying ant/post pelvic tilts 3s hold 2 x 10 each direction; Hooklying marches with post pelvic tilt x 10 BLE; HEP updated and reviewed with Andrea Rocha;   Andrea Rocha EDUCATION:  Education details: Andrea Rocha educated throughout session about proper posture and technique with exercises. Improved exercise technique, movement at target joints, use of target muscles after min to mod verbal, visual, tactile cues. Updated HEP Person educated: Andrea Rocha Education method: Explanation and Handouts Education comprehension: verbalized understanding and needs further education   HOME EXERCISE PROGRAM:  Access Code: [email or web address] URL: [email or web address] Date: 05/25/2022 Prepared by: Ria Comment  Exercises - Walking Program  - 1 x daily - 1-2 x weekly - Supine Pelvic Tilt  - 1 x daily - 7 x weekly - 2 sets - 10 reps - 3s hold - Hooklying Lumbar Rotation  - 1 x daily - 7 x weekly - 2 sets - 10 reps - 3s hold - Supine March with Posterior Pelvic Tilt  - 1 x daily - 7 x weekly - 2 sets - 10 reps - 3s hold   ASSESSMENT:  CLINICAL IMPRESSION: Andrea Rocha demonstrates excellent motivation during session today. She completed mODI scoring 56% indicating moderate perception of disability related to her back pain. Additional testing performed with Andrea Rocha during session today. Notable findings includes significant tenderness to palpation to lumbar paraspinal bilaterally in lower lumber segments (L3-L5) and over sacrum as well as decreased hip extension  strength bilaterally. Initiated gentle lumbar AROM with pelvic tilts and rotation rocking as well as light strengthening with low hooklying marches. Updated HEP. Andrea Rocha encouraged to continue regular walking. Discussed possible trial of TENS to see if it allows her to be more active. Andrea Rocha will benefit from Andrea Rocha services to address deficits in strength, mobility, and pain in order to return to full function at home with less pain.   OBJECTIVE IMPAIRMENTS: decreased activity tolerance, decreased endurance, obesity, and pain.   ACTIVITY LIMITATIONS: standing, stairs, and caring for others  PARTICIPATION LIMITATIONS: meal prep, cleaning, laundry, shopping, and community activity  PERSONAL FACTORS: Behavior pattern, Past/current experiences, Social background, Time since onset of injury/illness/exacerbation, and 3+ comorbidities: MDD, DMII, anxiety, PTSD, obesity, and migraines  are also affecting Andrea Rocha's functional outcome.   REHAB POTENTIAL: Fair    CLINICAL DECISION MAKING: Unstable/unpredictable  EVALUATION COMPLEXITY: High   GOALS: Goals reviewed with Andrea Rocha? No  SHORT TERM GOALS: Target date: 06/16/2022  Andrea Rocha will be independent with HEP in order to improve  strength and decrease back pain to improve pain-free function at home and work. Baseline:  Goal status: INITIAL   LONG TERM GOALS: Target date: 07/14/2022  Andrea Rocha will increase FOTO to at least 50 to demonstrate significant improvement in function at home and work related to back pain  Baseline: 05/19/22: 44 Goal status: INITIAL  2.  Andrea Rocha will decrease worst back pain by at least 2 points on the NPRS in order to demonstrate clinically significant reduction in back pain. Baseline: 05/19/22: worst: 8/10; Goal status: INITIAL  3.  Andrea Rocha will decrease mODI score by at least 13 points in order demonstrate clinically significant reduction in back pain/disability.       Baseline: 05/19/22: To be completed; 05/25/22: 56% Goal status: INITIAL  4.  Andrea Rocha will  report at least a 50% reduction in her back symptoms in order to allow her to walk extended distances so she can exercise without an excessive increase in her pain. Baseline:  Goal status: INITIAL   PLAN: Andrea Rocha FREQUENCY: 1-2x/week  Andrea Rocha DURATION: 8 weeks  PLANNED INTERVENTIONS: Therapeutic exercises, Therapeutic activity, Neuromuscular re-education, Balance training, Gait training, Andrea Rocha/Family education, Self Care, Joint mobilization, Joint manipulation, Vestibular training, Canalith repositioning, Orthotic/Fit training, DME instructions, Dry Needling, Electrical stimulation, Spinal manipulation, Spinal mobilization, Cryotherapy, Moist heat, Taping, Traction, Ultrasound, Ionotophoresis 4mg /ml Dexamethasone, Manual therapy, and Re-evaluation.  PLAN FOR NEXT SESSION:  Consider Readiness Ruler, review HEP and progress as appropriate. Progress strengthening;   Sharalyn Ink Jarius Dieudonne Andrea Rocha, DPT, GCS  Renalda Locklin, Andrea Rocha 05/25/2022, 10:19 AM

## 2022-05-25 ENCOUNTER — Ambulatory Visit: Payer: Medicaid Other

## 2022-05-25 DIAGNOSIS — G8929 Other chronic pain: Secondary | ICD-10-CM

## 2022-05-25 DIAGNOSIS — M5442 Lumbago with sciatica, left side: Secondary | ICD-10-CM | POA: Diagnosis not present

## 2022-05-27 ENCOUNTER — Ambulatory Visit: Payer: Medicaid Other

## 2022-05-27 DIAGNOSIS — R809 Proteinuria, unspecified: Secondary | ICD-10-CM | POA: Diagnosis not present

## 2022-05-27 DIAGNOSIS — G4733 Obstructive sleep apnea (adult) (pediatric): Secondary | ICD-10-CM | POA: Diagnosis not present

## 2022-05-27 DIAGNOSIS — R0600 Dyspnea, unspecified: Secondary | ICD-10-CM | POA: Diagnosis not present

## 2022-05-27 DIAGNOSIS — J4540 Moderate persistent asthma, uncomplicated: Secondary | ICD-10-CM | POA: Diagnosis not present

## 2022-05-27 DIAGNOSIS — R052 Subacute cough: Secondary | ICD-10-CM | POA: Diagnosis not present

## 2022-05-27 DIAGNOSIS — G8929 Other chronic pain: Secondary | ICD-10-CM

## 2022-05-27 DIAGNOSIS — E1129 Type 2 diabetes mellitus with other diabetic kidney complication: Secondary | ICD-10-CM | POA: Diagnosis not present

## 2022-05-27 DIAGNOSIS — R918 Other nonspecific abnormal finding of lung field: Secondary | ICD-10-CM | POA: Diagnosis not present

## 2022-06-01 ENCOUNTER — Ambulatory Visit: Payer: Medicaid Other

## 2022-06-03 ENCOUNTER — Ambulatory Visit: Payer: Medicaid Other

## 2022-06-03 DIAGNOSIS — G8929 Other chronic pain: Secondary | ICD-10-CM

## 2022-06-08 ENCOUNTER — Ambulatory Visit: Payer: Medicaid Other

## 2022-06-08 DIAGNOSIS — G8929 Other chronic pain: Secondary | ICD-10-CM

## 2022-06-15 ENCOUNTER — Other Ambulatory Visit: Payer: Medicaid Other | Admitting: Licensed Clinical Social Worker

## 2022-06-15 ENCOUNTER — Ambulatory Visit: Payer: Medicaid Other

## 2022-06-15 NOTE — Patient Outreach (Signed)
Medicaid Managed Care Social Work Note  06/15/2022 Name:  Andrea Rocha MRN:  [id number] DOB:  1975-06-29  Andrea Rocha is an 47 y.o. year old female who is a primary patient of Patient, No Pcp Per.  The Medicaid Managed Care Coordination team was consulted for assistance with:  Mental Health Counseling and Resources  Ms. Andrea Rocha was given information about Medicaid Managed Care Coordination team services today. Andrea Rocha Patient agreed to services and verbal consent obtained.  Engaged with patient  for by telephone forinitial visit in response to referral for case management and/or care coordination services.   Assessments/Interventions:  Review of past medical history, allergies, medications, health status, including review of consultants reports, laboratory and other test data, was performed as part of comprehensive evaluation and provision of chronic care management services.  SDOH: (Social Determinant of Health) assessments and interventions performed: SDOH Interventions    Flowsheet Row Patient Outreach Telephone from 06/15/2022 in [place] POPULATION HEALTH DEPARTMENT  SDOH Interventions   Stress Interventions Offered Andrea Rocha Resources, Provide Counseling       Advanced Directives Status:  See Care Plan for related entries.  Care Plan                 Not on File  Medications Reviewed Today     Reviewed by Andrea Rocha, PT (Physical Therapist) on 05/25/22 at 1013  Med List Status: <None>   Medication Order Taking? Sig Documenting Provider Last Dose Status Informant           No Medications to Display                            There are no problems to display for this patient.   Conditions to be addressed/monitored per PCP order:  Anxiety and Depression  Care Plan : LCSW Plan of Care  Updates made by Andrea Bryant, LCSW since 06/15/2022 12:00 AM     Problem: Depression Identification (Depression)      Goal: Depressive  Symptoms Identified   Note:   Priority: High  Timeframe:  Short-Range Goal Priority:  High Start Date:   06/15/22           Expected End Date:  ongoing                     Follow Up Date--06/29/22 at 10:15 am  - keep 90 percent of scheduled appointments -consider counseling or psychiatry -consider bumping up your self-care  -consider creating a stronger support network   Why is this important?             Combatting depression may take some time.            If you don't feel better right away, don't give up on your treatment plan.    Current barriers:   Chronic Mental Health needs related to depression, PTSD and anxiety. Patient requires Support, Education, Resources, Referrals, Advocacy, and Care Coordination, in order to meet Unmet Mental Health Needs. Patient will implement clinical interventions discussed today to decrease symptoms of depression and increase knowledge and/or ability of: coping skills. Mental Health Concerns and Social Isolation Patient lacks knowledge of available community counseling agencies and resources.  Clinical Goal(s): verbalize understanding of plan for management of Anxiety, Depression, and PTSD and demonstrate a reduction in symptoms. Patient will connect with a provider for ongoing mental health treatment, increase coping skills, healthy habits, self-management  skills, and stress reduction        Clinical Interventions:  Assessed patient's previous and current treatment, coping skills, support system and barriers to care. Patient provided hx. Patient has been in therapy in and out since she as 47 years old. She reports not receiving therapy in the last 2-3 years and is interested in gaining mental health support.  Patient admits past trauma during childhood which contributes to her anxiety, stress and panic.  Patient has a strong support system including her spouse and two adult sons. She reports that her husband recently started therapy.  Verbalization  of feelings encouraged, motivational interviewing employed Emotional support provided, positive coping strategies explored. Establishing healthy boundaries emphasized and healthy self-care education provided Patient was educated on available mental health resources within her area that accept Medicaid and offer counseling and psychiatry. Patient is currently on the wait list for psychiatry at [facility] but she was informed by staff that the wait list is up to 6 months now and she would like to receive temporary mental health support until she can be established at [facility] as most of her care providers are at [facility].  Email sent to patient today with available mental health resources within her area that accept Medicaid and offer the services that she is interested in. Email included crisis support resources and [facility]'s walk in clinic hours Patient is agreeable to reviewing these resources with husband  LCSW provided education on relaxation techniques such as meditation, deep breathing, massage, grounding exercises or yoga that can activate the body's relaxation response and ease symptoms of stress and anxiety. LCSW ask that when pt is struggling with difficult emotions and racing thoughts that they start this relaxation response process. LCSW provided extensive education on healthy coping skills for anxiety. SW used active and reflective listening, validated patient's feelings/concerns, and provided emotional support. Patient will work on implementing appropriate self-care habits into their daily routine such as: staying positive, writing a gratitude list, drinking water, staying active around the house, taking their medications as prescribed, combating negative thoughts or emotions and staying connected with their family and friends. Positive reinforcement provided for this decision to work on this. Patient reports being on 90 mg of Cymbalta.  Patient was advised to exercise 10-20 minutes everyday, go outside in the  sunlight everyday for at least 10-15 minutes, drink 6-8 glasses of water a day, take vitamins/medications as directed and try to increase sleep.   Motivational Interviewing employed Depression screen reviewed  PHQ2/ PHQ9 completed or reviewed  Mindfulness or Relaxation training provided Active listening / Reflection utilized  Advance Care and HCPOA education provided Emotional Support Provided Problem Solving /Task Center strategies reviewed Provided psychoeducation for mental health needs  Provided brief CBT  Reviewed mental health medications and discussed importance of compliance:  Quality of sleep assessed & Sleep Hygiene techniques promoted  Participation in counseling encouraged  Verbalization of feelings encouraged  Suicidal Ideation/Homicidal Ideation assessed: Patient denies SI/HI  Review resources, discussed options and provided patient information about  Mental Health Resources Inter-disciplinary care team collaboration (see longitudinal plan of care) Patient Goals/Self-Care Activities: Over the next 120 days Attend scheduled medical appointments Utilize healthy coping skills and supportive resources discussed Contact PCP with any questions or concerns Keep 90 percent of counseling appointments Call your insurance provider for more information about your Enhanced Benefits  Check out counseling resources provided  Begin personal counseling with LCSW, to reduce and manage symptoms of Depression and Stress, until well-established with mental health provider  Incorporate into daily practice - relaxation techniques, deep breathing exercises, and mindfulness meditation strategies. Talk about feelings with friends, family members, spiritual advisor, etc. Contact LCSW directly [phone]), if you have questions, need assistance, or if additional social work needs are identified between now and our next scheduled telephone outreach call. Call 988 for mental health hotline/crisis  line if needed (24/7 available) Try techniques to reduce symptoms of anxiety/negative thinking (deep breathing, distraction, positive self talk, etc)  - develop a personal safety plan - develop a plan to deal with triggers like holidays, anniversaries - exercise at least 2 to 3 times per week - have a plan for how to handle bad days - journal feelings and what helps to feel better or worse - spend time or talk with others at least 2 to 3 times per week - watch for early signs of feeling worse - begin personal counseling - call and visit an old friend - check out volunteer opportunities - join a support group - laugh; watch a funny movie or comedian - learn and use visualization or guided imagery - perform a random act of kindness - practice relaxation or meditation daily - start or continue a personal journal - practice positive thinking and self-talk -continue with compliance of taking medication  -identify current effective and ineffective coping strategies.  -implement positive self-talk in care to increase self-esteem, confidence and feelings of control.  -consider alternative and complementary therapy approaches such as meditation, mindfulness or yoga.  -journaling, prayer, worship services, meditation or pastoral counseling.  -increase participation in pleasurable group activities such as hobbies, singing, sports or volunteering).  -consider the use of meditative movement therapy such as tai chi, yoga or qigong.  -start a regular daily exercise program based on tolerance, ability and patient choice to support positive thinking and activity    If you are experiencing a Mental Health or Behavioral Health Crisis or need someone to talk to, please call the Suicide and Crisis Lifeline: 988    Patient Goals: Initial goal       24- Hour Availability:    [facility]  [address] [place], [place] [phone] Crisis [phone]   [facility]  Service of the [facility] [phone]  [place] [facility]  [phone]    [facility]  [phone] (after hours)   Therapeutic Alternative/Mobile Crisis   [phone]   [place] National Suicide Hotline  331-825-9658 Len Childs) [place] [address]   Call (431) 323-0917 for mental health emergencies   [facility]  [phone]);  [place] and [employer]  [phone]); [place], [place], [place], [place], [place], [place], [place]    [place] [facility] for [facility] Residents For 24/7 walk-up access to mental health services for [facility] children (4+), adolescents and adults, please visit the [facility] located at [address] in [place], [place].  *[place] also provides comprehensive outpatient behavioral health services in a variety of locations around the [place].  Connect With [place] [address] [place], [place] [address] HelpLine: (314)779-9054 or 1-641-676-5545  Get Directions  Find Help 24/7 By Phone Call our 24-hour HelpLine at 770-774-9965 or (863)271-5465 for immediate assistance for mental health and substance abuse issues.  Walk-In Help [place]: [facility] (Ages 4 and Up) [place]: Emergency Dept., [facility] Additional Resources National Hopeline Network: 1-800-SUICIDE The National Suicide Prevention Lifeline: 1-800-273-TALK     10 LITTLE  Things To Do When You're Feeling Too Down To Do Anything  Take a shower. Even if you plan to stay in all day long and not see a soul, take a shower. It takes the most effort to hop in to the shower but once you do, you'll feel immediate results. It will wake you up and you'll be feeling much fresher (and cleaner too).  Brush and floss your teeth. Give your teeth a good brushing with a floss finish. It's a small task but it feels so good and you can check  'taking care of your health' off the list of things to do.  Do something small on your list. Most of [place] have some small thing we would like to get done (load of laundry, sew a button, email a friend). Doing one of these things will make you feel like you've accomplished something.  Drink water. Drinking water is easy right? It's also really beneficial for your health so keep a glass beside you all day and take sips often. It gives you energy and prevents you from boredom eating.  Do some floor exercises. The last thing you want to do is exercise but it might be just the thing you need the most. Keep it simple and do exercises that involve sitting or laying on the floor. Even the smallest of exercises release chemicals in the brain that make you feel good. Yoga stretches or core exercises are going to make you feel good with minimal effort.  Make your bed. Making your bed takes a few minutes but it's productive and you'll feel relieved when it's done. An unmade bed is a huge visual reminder that you're having an unproductive day. Do it and consider it your housework for the day.  Put on some nice clothes. Take the sweatpants off even if you don't plan to go anywhere. Put on clothes that make you feel good. Take a look in the mirror so your brain recognizes the sweatpants have been replaced with clothes that make you look great. It's an instant confidence booster.  Wash the dishes. A pile of dirty dishes in the sink is a reflection of your mood. It's possible that if you wash up the dishes, your mood will follow suit. It's worth a try.  Cook a real meal. If you have the luxury to have a "do nothing" day, you have time to make a real meal for yourself. Make a meal that you love to eat. The process is good to get you out of the funk and the food will ensure you have more energy for tomorrow.  Write out your thoughts by hand. When you hand write, you stimulate your brain to focus on the moment  that you're in so make yourself comfortable and write whatever comes into your mind. Put those thoughts out on paper so they stop spinning around in your head. Those thoughts might be the very thing holding you down.     06/15/2022    9:16 AM  Depression screen PHQ 2/9  Decreased Interest 0  Down, Depressed, Hopeless 1  PHQ - 2 Score 1   Follow up:  Patient agrees to Care Plan and Follow-up.  Plan: The Managed Medicaid care management team will reach out to the patient again over the next 30 days.  Dickie La, BSW, MSW, [employer] Managed Medicaid LCSW [facility]  [facility] [email or web address] Phone: [phone]

## 2022-06-15 NOTE — Patient Instructions (Signed)
Visit Information  Ms. Desautel was given information about Medicaid Managed Care team care coordination services as a part of their [place] benefit. Ardella TAKEMA KRAMPITZ verbally consented to engagement with the [facility] Managed Care team.   If you are experiencing a medical emergency, please call 911 or report to your local emergency department or urgent care.   If you have a non-emergency medical problem during routine business hours, please contact your provider's office and ask to speak with a nurse.   For questions related to your [place] health plan, please call: [phone]  If you would like to schedule transportation through your [place] plan, please call the following number at least 2 days in advance of your appointment: [phone].   There is no limit to the number of trips during the year between medical appointments, healthcare facilities, or pharmacies. Transportation must be scheduled at least 2 business days before but not more than thirty 30 days before of your appointment.  Call the Behavioral Health Crisis Line at 919-800-0952, at any time, 24 hours a day, 7 days a week. If you are in danger or need immediate medical attention call 911.  If you would like help to quit smoking, call 1-800-QUIT-NOW ((678)468-2707) OR Espaol: 1-855-Djelo-Ya (4-132-440-1027) o para ms informacin haga clic aqu or Text READY to 253-664 to register via text  Following is a copy of your plan of care:  Care Plan : LCSW Plan of Care  Updates made by Gustavus Bryant, LCSW since 06/15/2022 12:00 AM     Problem: Depression Identification (Depression)      Goal: Depressive Symptoms Identified   Note:   Priority: High  Timeframe:  Short-Range Goal Priority:  High Start Date:   06/15/22           Expected End Date:  ongoing                     Follow Up Date--06/29/22 at 10:15 am  - keep 90 percent of scheduled appointments -consider  counseling or psychiatry -consider bumping up your self-care  -consider creating a stronger support network   Why is this important?             Combatting depression may take some time.            If you don't feel better right away, don't give up on your treatment plan.    Current barriers:   Chronic Mental Health needs related to depression, PTSD and anxiety. Patient requires Support, Education, Resources, Referrals, Advocacy, and Care Coordination, in order to meet Unmet Mental Health Needs. Patient will implement clinical interventions discussed today to decrease symptoms of depression and increase knowledge and/or ability of: coping skills. Mental Health Concerns and Social Isolation Patient lacks knowledge of available community counseling agencies and resources.  Clinical Goal(s): verbalize understanding of plan for management of Anxiety, Depression, and PTSD and demonstrate a reduction in symptoms. Patient will connect with a provider for ongoing mental health treatment, increase coping skills, healthy habits, self-management skills, and stress reduction        Patient Goals/Self-Care Activities: Over the next 120 days Attend scheduled medical appointments Utilize healthy coping skills and supportive resources discussed Contact PCP with any questions or concerns Keep 90 percent of counseling appointments Call your insurance provider for more information about your Enhanced Benefits  Check out counseling resources provided  Begin personal counseling with LCSW, to reduce and manage symptoms of Depression and  Stress, until well-established with mental health provider Incorporate into daily practice - relaxation techniques, deep breathing exercises, and mindfulness meditation strategies. Talk about feelings with friends, family members, spiritual advisor, etc. Contact LCSW directly [phone]), if you have questions, need assistance, or if additional social work needs are  identified between now and our next scheduled telephone outreach call. Call 988 for mental health hotline/crisis line if needed (24/7 available) Try techniques to reduce symptoms of anxiety/negative thinking (deep breathing, distraction, positive self talk, etc)  - develop a personal safety plan - develop a plan to deal with triggers like holidays, anniversaries - exercise at least 2 to 3 times per week - have a plan for how to handle bad days - journal feelings and what helps to feel better or worse - spend time or talk with others at least 2 to 3 times per week - watch for early signs of feeling worse - begin personal counseling - call and visit an old friend - check out volunteer opportunities - join a support group - laugh; watch a funny movie or comedian - learn and use visualization or guided imagery - perform a random act of kindness - practice relaxation or meditation daily - start or continue a personal journal - practice positive thinking and self-talk -continue with compliance of taking medication  -identify current effective and ineffective coping strategies.  -implement positive self-talk in care to increase self-esteem, confidence and feelings of control.  -consider alternative and complementary therapy approaches such as meditation, mindfulness or yoga.  -journaling, prayer, worship services, meditation or pastoral counseling.  -increase participation in pleasurable group activities such as hobbies, singing, sports or volunteering).  -consider the use of meditative movement therapy such as tai chi, yoga or qigong.  -start a regular daily exercise program based on tolerance, ability and patient choice to support positive thinking and activity    If you are experiencing a Mental Health or Behavioral Health Crisis or need someone to talk to, please call the Suicide and Crisis Lifeline: 988      24- Hour Availability:    [facility]  [address] [place], [place] [phone] Crisis [phone]   [facility] [phone]  [place] Crisis Service  [phone]    [facility]  [phone] (after hours)   Therapeutic Alternative/Mobile Crisis   [phone]   [place] National Suicide Hotline  564 590 0691 Len Childs) [place] 160   Call [phone] for mental health emergencies   [facility]  [phone]);  [place] and [employer]  [phone]); [place], [place], [place], [place], [place], [place], [place]    [place] [facility] for [facility] Residents For 24/7 walk-up access to mental health services for [facility] children (4+), adolescents and adults, please visit the [facility] located at [address] in [place], [place].  *[place] also provides comprehensive outpatient behavioral health services in a variety of locations around the [place].  Connect With [place] [address] [place], [place] [address] HelpLine: 613-368-3471 or 1-(732) 870-5825  Get Directions  Find Help 24/7 By Phone Call our 24-hour HelpLine at (250)223-1517 or 684-649-3536 for immediate assistance for mental health and substance abuse issues.  Walk-In Help [place]: [facility] (Ages 4 and Up) [place]: Emergency Dept., [facility] Additional Resources National Hopeline Network: 1-800-SUICIDE The National Suicide Prevention Lifeline: 781-850-2951  Patient Goals: Initial goal  Dickie La, BSW, MSW, [employer] Managed Medicaid LCSW [facility]  [facility] [email or web address] Phone: [phone]

## 2022-06-16 NOTE — Therapy (Signed)
OUTPATIENT PHYSICAL THERAPY LUMBAR TREATMENT   Patient Name: Andrea Rocha MRN: [id number] DOB:April 08, 1975, 47 y.o., female Today's Date: 06/17/2022  END OF SESSION:  PT End of Session - 06/17/22 0849     Visit Number 3    Number of Visits 17    Date for PT Re-Evaluation 07/14/22    Authorization Type eval: 05/19/22    PT Start Time 0850    PT Stop Time 0930    PT Time Calculation (min) 40 min    Activity Tolerance Patient tolerated treatment well    Behavior During Therapy Andrea Rocha for tasks assessed/performed            History reviewed. No pertinent past medical history. History reviewed. No pertinent surgical history. There are no problems to display for this patient.  PCP: Patient, No Pcp Per  REFERRING PROVIDER: Sherryl Manges*  REFERRING DIAG: M54.16 (ICD-10-CM) - Radiculopathy, lumbar region  RATIONALE FOR EVALUATION AND TREATMENT: Rehabilitation  THERAPY DIAG: Chronic left-sided low back pain with left-sided sciatica  ONSET DATE: 05/19/2019 (approximate);  FOLLOW-UP APPT SCHEDULED WITH REFERRING PROVIDER: Yes   FROM INITIAL EVALUATION SUBJECTIVE:                                                                                                                                                                                         SUBJECTIVE STATEMENT:  L low back pain  PERTINENT HISTORY:  Andrea Rocha is a 47 year old female referred to physical therapy for left low back pain and with LLE radiculopathy which started about 3 years ago and has progressively worsened. She did see a physician at [facility] who ordered x-rays an MRI. Per patient MRI showed spinal spinal stenosis and disc bulge however images/report are not available to review at this time. She was told she was too obese to fit on the surgical frame and was not a surgical candidate. She was unhappy with the doctor and transferred to [facility] She reports that the doctor there told her that she needed to  lose weight in order for her back pain to improve. She has since switched to a separate neurosurgeon at [facility] who wanted to start the process over by initiating physical therapy and obtaining new imaging. The pain radiates down the posterior aspect of her left leg into the thigh but no further. She tried physical therapy a couple years ago which did not help with her pain however she does report some benefit with morning stretches. She has also had spinal injections without benefit. Pt reports that she has has back pain since she was 17 when she bruised her tailbone. 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 Her back pain could not tolerate being in one location for that long. Pain is primarily worsened with extended standing and walking. She reports intermittent L calf and ankle swelling and they have yet to find an etiology. No personal history of cancer or lymphedema. Denies history of CHF. Pt lives close to [facility] and has talked to her husband about starting a walking program. Pt has an upcoming appointment with dietetics and she is hoping to implement some healthier eating habits in the home. Pt and husband are both smokers and are trying to quit. She is using patches and is on day 4 of a new Chantix prescription. She does report significant stressors in the home including family discord and concerns for her husband regarding recent unexplained weight loss. She has a history of History of scoliosis, obesity, anxiety, MDD, PTSD, migraines, and DMII (recent A1C 6.7).  PAIN:    Pain Intensity: Present: 4/10, Best: 4/10, Worst: 8/10 Pain location: Left low back Pain Quality: stabbing  Radiating: Yes into L buttock and posterior L thigh Numbness/Tingling: Yes,  in the L thigh and calf (L ankle and calf swelling, no DVT).  Focal Weakness: Yes, buckled once Aggravating factors: extended standing, walking (100' before stopping due to pain and DOE), ascending stairs; Relieving factors: changing positions, sitting after extended standing, topical muscle rub (Tiger Balm and Salonpas), heat makes it hurt worse unless her muscles are very tight and then she uses it sparingly.   24-hour pain behavior: activity dependent and usually worsens as day progresses How long can you sit: unlimited How long can you stand: 5 minutes History of prior back injury, pain, surgery, or therapy: Yes, back injury at 17 but no history of surgery Dominant hand: right Imaging: Yes, not available for review  PRECAUTIONS: None  WEIGHT BEARING RESTRICTIONS: No  FALLS: Has patient fallen in last 6 months? Yes. Number of falls 1, pt reports LOC  Living Environment Lives with: lives with their spouse Lives in: Mobile home Stairs: Yes: External: 4 steps; can reach both rails Has following equipment at home: Single point cane tub shower  Prior level of function: Independent  Occupational demands: Not currently working, used to work at the [employer]: [place], [employer], [employer], "I hate being in public now."   Patient Goals: "I would like to be able to turn my upper body and not get a cramp. I would like to be able to go to the grocery store." Pt would like to be able to move easier so she can play with her granddaughter.    OBJECTIVE:  Patient Surveys  FOTO 44, predicted improvement to 53; mODI: To be completed  Cognition Patient is oriented to person, place, and time.  Recent memory is intact.  Remote memory is intact.  Attention span and concentration are intact.  Expressive speech is intact.  Patient's fund of knowledge is within normal limits for educational level.    Gross Musculoskeletal Assessment Tremor: None Bulk:  Normal Tone: Normal No visible step-off along spinal column, no signs of scoliosis  GAIT: Distance walked: 100' Assistive device utilized: None Level of assistance: Complete Independence Comments: No gross deficits in gait noted. Full gait assessment deferred  Posture: Lumbar lordosis: WNL Iliac crest height: Equal bilaterally Lumbar lateral shift: Negative  AROM AROM (Normal range in degrees) AROM   Lumbar   Flexion (65) Mod loss of segmental mobility*  Extension (30) Mild loss of segmental mobility*  Right lateral flexion (25) Mod loss  Left lateral flexion (25) Mod loss  Right rotation (30) Mod loss  Left rotation (30) Mod loss      Hip Right Left  Flexion (125)    Extension (15)    Abduction (40)    Adduction     Internal Rotation (45)    External Rotation (45)        Knee    Flexion (135)    Extension (0)        Ankle    Dorsiflexion (20)    Plantarflexion (50)    Inversion (35)    Eversion (15)    (* = pain; Blank rows = not tested)  LE MMT: MMT (out of 5) Right  Left   Hip flexion 5 5  Hip extension 3+ 3+  Hip abduction 4+ 4+  Hip adduction 4+ 4+  Hip internal rotation 5 5  Hip external rotation 5 5  Knee flexion (seated) 5 5  Knee extension 5 5  Ankle dorsiflexion 5 5  (* = pain; Blank rows = not tested)  Sensation Grossly intact to light touch throughout bilateral LEs as determined by testing dermatomes L2-S2. Proprioception, stereognosis, and hot/cold testing deferred on this date.  Muscle Length Hamstrings: R: 90 degrees  L: 90 degrees Ely (quadriceps): R: Not examined L: Not examined Thomas (hip flexors): R: Not examined L: Not examined Ober: R: Not examined L: Not examined  Palpation Tenderness to palpation to lumbar paraspinal bilaterally in lower lumber segments (L3-L5) and over sacrum.  Passive Accessory Intervertebral Motion Unable to tolerate so mobility unable to be assesed;  Special Tests Lumbar Radiculopathy and  Discogenic: Centralization and Peripheralization (SN 92, -LR 0.12): Not examined Slump (SN 83, -LR 0.32): R: Not examined L: Not examined SLR (SN 92, -LR 0.29): R: Negative L:  Negative Crossed SLR (SP 90): R: Negative L: Negative  Facet Joint: Extension-Rotation (SN 100, -LR 0.0): R: Not examined L: Not examined  Lumbar Foraminal Stenosis: Lumbar quadrant (SN 70): R: Not examined L: Not examined  Hip: FABER (SN 81): R: Negative L: Negative FADIR (SN 94): R: Negative L: Negative Hip scour (SN 50): R: Negative L: Negative Figure 4: R: Negative L: Positive for low back pain  SIJ:  Thigh Thrust (SN 88, -LR 0.18) : R: Not examined L: Not examined  Piriformis Syndrome: FAIR Test (SN 88, SP 83): R: Not examined L: Not examined  Functional Tasks Deferred  Beighton scale: Deferred   TODAY'S TREATMENT:   SUBJECTIVE: Pt reports that she is doing alright today. She has not attended therapy since her visit on 05/25/22. Reports 4/10 lumbar pain upon arrival. Recently increased Chantix and has progressively decreased the amount of cigarettes she is smoking. She is down to 1/2 pack per day. She has an echocardiogram scheduled for next week due to persistent coughing.    PAIN: 4/10 low back pain upon arrival;   Ther-ex  NuStep L0 (seat 9, arms 8) x 5 minutes for warm-up, cardiovascular challenge, and BLE strengthening during interval history, therapist adjusting resistance; Hooklying lumbar rotation rocking 2 x 60s; Hooklying ant/post pelvic tilts 3s hold 2 x 10 each direction; Single knee to chest stretch 2 x 45s BLE; Hooklying marches with post  pelvic tilt and transverse abdominis contraction (belly button to spine) 2 x 10 BLE; Hooklying clams with manual resistance from therapist 2 x 10 BLE; Hooklying adductor squeeze with manual resistance from therapist 2 x 10 BLE; Supine isometric lumbar flexion/extension holding cane at 90 degrees and therapist providing resistance in both  directions, 3s hold x 10 each direction; Supine isometric lumbar rotation holding cane at 90 degrees and therapist providing resistance in both directions, 3s hold x 10 each direction; Standing mini squats at // bars x 10; Standing lumbar L stretch at // bars 10s hold x 5; HEP updated and reviewed with patient;   PATIENT EDUCATION:  Education details: Pt educated throughout session about proper posture and technique with exercises. Improved exercise technique, movement at target joints, use of target muscles after min to mod verbal, visual, tactile cues. Updated HEP Person educated: Patient Education method: Explanation and Handouts Education comprehension: verbalized understanding and needs further education   HOME EXERCISE PROGRAM:  Access Code: [email or web address] URL: [email or web address] Date: 06/17/2022 Prepared by: Ria Comment  Exercises - Walking Program  - 1 x daily - 1-2 x weekly - Supine Pelvic Tilt  - 1 x daily - 7 x weekly - 2 sets - 10 reps - 3s hold - Hooklying Lumbar Rotation  - 1 x daily - 7 x weekly - 2 sets - 10 reps - 3s hold - Supine March with Posterior Pelvic Tilt  - 1 x daily - 7 x weekly - 2 sets - 10 reps - 3s hold - Squat at Table  - 1 x daily - 7 x weekly - 2 sets - 10 reps - 3s hold - Standing 'L' Stretch at Counter  - 1 x daily - 7 x weekly - 3 reps - 30s hold   ASSESSMENT:  CLINICAL IMPRESSION: Patient demonstrates excellent motivation during session today. Progressed gentle lumbar AROM as well as light strengthening. Introduced additional isometric low back exercises. Pt reports that standing squats relieve her back pain so added to HEP. Pt encouraged to continue regular walking. She will benefit from PT services to address deficits in strength, mobility, and pain in order to return to full function at home with less pain.   OBJECTIVE IMPAIRMENTS: decreased activity tolerance, decreased endurance, obesity, and pain.   ACTIVITY LIMITATIONS:  standing, stairs, and caring for others  PARTICIPATION LIMITATIONS: meal prep, cleaning, laundry, shopping, and community activity  PERSONAL FACTORS: Behavior pattern, Past/current experiences, Social background, Time since onset of injury/illness/exacerbation, and 3+ comorbidities: MDD, DMII, anxiety, PTSD, obesity, and migraines  are also affecting patient's functional outcome.   REHAB POTENTIAL: Fair    CLINICAL DECISION MAKING: Unstable/unpredictable  EVALUATION COMPLEXITY: High   GOALS: Goals reviewed with patient? No  SHORT TERM GOALS: Target date: 06/16/2022  Pt will be independent with HEP in order to improve strength and decrease back pain to improve pain-free function at home and work. Baseline:  Goal status: INITIAL   LONG TERM GOALS: Target date: 07/14/2022  Pt will increase FOTO to at least 50 to demonstrate significant improvement in function at home and work related to back pain  Baseline: 05/19/22: 44 Goal status: INITIAL  2.  Pt will decrease worst back pain by at least 2 points on the NPRS in order to demonstrate clinically significant reduction in back pain. Baseline: 05/19/22: worst: 8/10; Goal status: INITIAL  3.  Pt will decrease mODI score by at least 13 points in order demonstrate clinically significant reduction in back pain/disability.  Baseline: 05/19/22: To be completed; 05/25/22: 56% Goal status: INITIAL  4.  Pt will report at least a 50% reduction in her back symptoms in order to allow her to walk extended distances so she can exercise without an excessive increase in her pain. Baseline:  Goal status: INITIAL   PLAN: PT FREQUENCY: 1-2x/week  PT DURATION: 8 weeks  PLANNED INTERVENTIONS: Therapeutic exercises, Therapeutic activity, Neuromuscular re-education, Balance training, Gait training, Patient/Family education, Self Care, Joint mobilization, Joint manipulation, Vestibular training, Canalith repositioning, Orthotic/Fit training, DME  instructions, Dry Needling, Electrical stimulation, Spinal manipulation, Spinal mobilization, Cryotherapy, Moist heat, Taping, Traction, Ultrasound, Ionotophoresis 4mg /ml Dexamethasone, Manual therapy, and Re-evaluation.  PLAN FOR NEXT SESSION:  Consider Readiness Ruler, review HEP and progress as appropriate. Progress strengthening;   Sharalyn Ink Joanthan Hlavacek PT, DPT, GCS  Autym Siess, PT 06/17/2022, 9:33 AM

## 2022-06-17 ENCOUNTER — Ambulatory Visit: Payer: Medicaid Other | Attending: Student

## 2022-06-17 DIAGNOSIS — G8929 Other chronic pain: Secondary | ICD-10-CM | POA: Insufficient documentation

## 2022-06-17 DIAGNOSIS — M5442 Lumbago with sciatica, left side: Secondary | ICD-10-CM | POA: Insufficient documentation

## 2022-06-21 NOTE — Therapy (Signed)
OUTPATIENT PHYSICAL THERAPY LUMBAR TREATMENT   Patient Name: Andrea Rocha MRN: [id number] DOB:08-13-1975, 47 y.o., female Today's Date: 06/23/2022  END OF SESSION:  PT End of Session - 06/22/22 1154     Visit Number 4    Number of Visits 17    Date for PT Re-Evaluation 07/14/22    Authorization Type eval: 05/19/22    PT Start Time 1150    PT Stop Time 1230    PT Time Calculation (min) 40 min    Activity Tolerance Patient tolerated treatment well    Behavior During Therapy [facility] for tasks assessed/performed            History reviewed. No pertinent past medical history. History reviewed. No pertinent surgical history. There are no problems to display for this patient.  PCP: Patient, No Pcp Per  REFERRING PROVIDER: Sherryl Manges*  REFERRING DIAG: M54.16 (ICD-10-CM) - Radiculopathy, lumbar region  RATIONALE FOR EVALUATION AND TREATMENT: Rehabilitation  THERAPY DIAG: Chronic left-sided low back pain with left-sided sciatica  ONSET DATE: 05/19/2019 (approximate);  FOLLOW-UP APPT SCHEDULED WITH REFERRING PROVIDER: Yes   FROM INITIAL EVALUATION SUBJECTIVE:                                                                                                                                                                                         SUBJECTIVE STATEMENT:  L low back pain  PERTINENT HISTORY:  [name] is a 47 year old female referred to physical therapy for left low back pain and with LLE radiculopathy which started about 3 years ago and has progressively worsened. She did see a physician at [facility] who ordered x-rays an MRI. Per patient MRI showed spinal spinal stenosis and disc bulge however images/report are not available to review at this time. She was told she was too obese to fit on the surgical frame and was not a surgical candidate. She was unhappy with the doctor and transferred to [facility]. She reports that the doctor there told her that she needed  to lose weight in order for her back pain to improve. She has since switched to a separate neurosurgeon at [facility] who wanted to start the process over by initiating physical therapy and obtaining new imaging. The pain radiates down the posterior aspect of her left leg into the thigh but no further. She tried physical therapy a couple years ago which did not help with her pain however she does report some benefit with morning stretches. She has also had spinal injections without benefit. Pt reports that she has has back pain since she was 17 when she bruised her tailbone. 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 Her back pain could not tolerate being in one location for that long. Pain is primarily worsened with extended standing and walking. She reports intermittent L calf and ankle swelling and they have yet to find an etiology. No personal history of cancer or lymphedema. Denies history of CHF. Pt lives close to [facility] and has talked to her husband about starting a walking program. Pt has an upcoming appointment with dietetics and she is hoping to implement some healthier eating habits in the home. Pt and husband are both smokers and are trying to quit. She is using patches and is on day 4 of a new Chantix prescription. She does report significant stressors in the home including family discord and concerns for her husband regarding recent unexplained weight loss. She has a history of History of scoliosis, obesity, anxiety, MDD, PTSD, migraines, and DMII (recent A1C 6.7).  PAIN:    Pain Intensity: Present: 4/10, Best: 4/10, Worst: 8/10 Pain location: Left low back Pain Quality: stabbing  Radiating: Yes into L buttock and posterior L thigh Numbness/Tingling:  Yes, in the L thigh and calf (L ankle and calf swelling, no DVT).  Focal Weakness: Yes, buckled once Aggravating factors: extended standing, walking (100' before stopping due to pain and DOE), ascending stairs; Relieving factors: changing positions, sitting after extended standing, topical muscle rub (Tiger Balm and Salonpas), heat makes it hurt worse unless her muscles are very tight and then she uses it sparingly.   24-hour pain behavior: activity dependent and usually worsens as day progresses How long can you sit: unlimited How long can you stand: 5 minutes History of prior back injury, pain, surgery, or therapy: Yes, back injury at 17 but no history of surgery Dominant hand: right Imaging: Yes, not available for review  PRECAUTIONS: None  WEIGHT BEARING RESTRICTIONS: No  FALLS: Has patient fallen in last 6 months? Yes. Number of falls 1, pt reports LOC  Living Environment Lives with: lives with their spouse Lives in: Mobile home Stairs: Yes: External: 4 steps; can reach both rails Has following equipment at home: Single point cane tub shower  Prior level of function: Independent  Occupational demands: Not currently working, used to work at the [employer]: [place], [employer], [employer], "I hate being in public now."   Patient Goals: "I would like to be able to turn my upper body and not get a cramp. I would like to be able to go to the grocery store." Pt would like to be able to move easier so she can play with her granddaughter.    OBJECTIVE:  Patient Surveys  FOTO 44, predicted improvement to 98; mODI: To be completed  Cognition Patient is oriented to person, place, and time.  Recent memory is intact.  Remote memory is intact.  Attention span and concentration are intact.  Expressive speech is intact.  Patient's fund of knowledge is within normal limits for educational level.    Gross Musculoskeletal Assessment Tremor: None Bulk:  Normal Tone: Normal No visible step-off along spinal column, no signs of scoliosis  GAIT: Distance walked: 100' Assistive device utilized: None Level of assistance: Complete Independence Comments: No gross deficits in gait noted. Full gait assessment deferred  Posture: Lumbar lordosis: WNL Iliac crest height: Equal bilaterally Lumbar lateral shift: Negative  AROM AROM (Normal range in degrees) AROM   Lumbar   Flexion (65) Mod loss of segmental mobility*  Extension (30) Mild loss of segmental mobility*  Right lateral flexion (25) Mod loss  Left lateral flexion (25) Mod loss  Right rotation (30) Mod loss  Left rotation (30) Mod loss      Hip Right Left  Flexion (125)    Extension (15)    Abduction (40)    Adduction     Internal Rotation (45)    External Rotation (45)        Knee    Flexion (135)    Extension (0)        Ankle    Dorsiflexion (20)    Plantarflexion (50)    Inversion (35)    Eversion (15)    (* = pain; Blank rows = not tested)  LE MMT: MMT (out of 5) Right  Left   Hip flexion 5 5  Hip extension 3+ 3+  Hip abduction 4+ 4+  Hip adduction 4+ 4+  Hip internal rotation 5 5  Hip external rotation 5 5  Knee flexion (seated) 5 5  Knee extension 5 5  Ankle dorsiflexion 5 5  (* = pain; Blank rows = not tested)  Sensation Grossly intact to light touch throughout bilateral LEs as determined by testing dermatomes L2-S2. Proprioception, stereognosis, and hot/cold testing deferred on this date.  Muscle Length Hamstrings: R: 90 degrees  L: 90 degrees Ely (quadriceps): R: Not examined L: Not examined Thomas (hip flexors): R: Not examined L: Not examined Ober: R: Not examined L: Not examined  Palpation Tenderness to palpation to lumbar paraspinal bilaterally in lower lumber segments (L3-L5) and over sacrum.  Passive Accessory Intervertebral Motion Unable to tolerate so mobility unable to be assesed;  Special Tests Lumbar Radiculopathy and  Discogenic: Centralization and Peripheralization (SN 92, -LR 0.12): Not examined Slump (SN 83, -LR 0.32): R: Not examined L: Not examined SLR (SN 92, -LR 0.29): R: Negative L:  Negative Crossed SLR (SP 90): R: Negative L: Negative  Facet Joint: Extension-Rotation (SN 100, -LR 0.0): R: Not examined L: Not examined  Lumbar Foraminal Stenosis: Lumbar quadrant (SN 70): R: Not examined L: Not examined  Hip: FABER (SN 81): R: Negative L: Negative FADIR (SN 94): R: Negative L: Negative Hip scour (SN 50): R: Negative L: Negative Figure 4: R: Negative L: Positive for low back pain  SIJ:  Thigh Thrust (SN 88, -LR 0.18) : R: Not examined L: Not examined  Piriformis Syndrome: FAIR Test (SN 88, SP 83): R: Not examined L: Not examined  Functional Tasks Deferred  Beighton scale: Deferred   TODAY'S TREATMENT:   SUBJECTIVE: Pt reports that she is doing alright today but is having a lot of personal stress. This has made smoking cessation challenging. Reports 4/10 lumbar pain upon arrival. She has an echocardiogram scheduled for tomorrow. No notable improvement in back pain since starting with therapy.    PAIN: 4/10 low back pain upon arrival;   Ther-ex  NuStep L0-4 (seat 10, arms 8) x 5 minutes for warm-up, cardiovascular challenge, and BLE strengthening during interval history, therapist adjusting resistance; Hooklying lumbar rotation rocking 2 x 60s; Hooklying ant/post pelvic tilts 3s hold 2 x 60s; Single knee to chest stretch 2 x 45s BLE; Hooklying marches with post pelvic tilt and transverse abdominis contraction (belly button to spine) 2 x 60s BLE; Hooklying  clams with manual resistance from therapist 2 x 10 BLE; Hooklying adductor squeeze with manual resistance from therapist 2 x 10 BLE;   Manual Therapy  Supine L hip long axis distraction with belt assist at ankle 3 x 30s; Supine L hip inferior mobilizations at 90 flexion with belt assist, grade II-III, 3 x 30s; Supine L hip  medial to lateral mobilizations at 45 flexion with belt assist, grade II-III, 3 x 30s; Supine L hip inferior mobilizations in FABER position with belt assist, grade II-III, 3 x 30s;   Not performed: Supine isometric lumbar flexion/extension holding cane at 90 degrees and therapist providing resistance in both directions, 3s hold x 10 each direction; Supine isometric lumbar rotation holding cane at 90 degrees and therapist providing resistance in both directions, 3s hold x 10 each direction; Standing mini squats at // bars x 10; Standing lumbar L stretch at // bars 10s hold x 5;   PATIENT EDUCATION:  Education details: Pt educated throughout session about proper posture and technique with exercises. Improved exercise technique, movement at target joints, use of target muscles after min to mod verbal, visual, tactile cues. Person educated: Patient Education method: Explanation and Handouts Education comprehension: verbalized understanding and needs further education   HOME EXERCISE PROGRAM:  Access Code: [email or web address] URL: [email or web address] Date: 06/17/2022 Prepared by: Ria Comment  Exercises - Walking Program  - 1 x daily - 1-2 x weekly - Supine Pelvic Tilt  - 1 x daily - 7 x weekly - 2 sets - 10 reps - 3s hold - Hooklying Lumbar Rotation  - 1 x daily - 7 x weekly - 2 sets - 10 reps - 3s hold - Supine March with Posterior Pelvic Tilt  - 1 x daily - 7 x weekly - 2 sets - 10 reps - 3s hold - Squat at Table  - 1 x daily - 7 x weekly - 2 sets - 10 reps - 3s hold - Standing 'L' Stretch at Counter  - 1 x daily - 7 x weekly - 3 reps - 30s hold   ASSESSMENT:  CLINICAL IMPRESSION: Patient demonstrates excellent motivation during session today. Progressed gentle lumbar AROM as well as light strengthening. Introduced L hip mobilizations for lumbar distraction. No HEP modifications on this date. Pt encouraged to continue regular walking. She will benefit from PT services to  address deficits in strength, mobility, and pain in order to return to full function at home with less pain.   OBJECTIVE IMPAIRMENTS: decreased activity tolerance, decreased endurance, obesity, and pain.   ACTIVITY LIMITATIONS: standing, stairs, and caring for others  PARTICIPATION LIMITATIONS: meal prep, cleaning, laundry, shopping, and community activity  PERSONAL FACTORS: Behavior pattern, Past/current experiences, Social background, Time since onset of injury/illness/exacerbation, and 3+ comorbidities: MDD, DMII, anxiety, PTSD, obesity, and migraines  are also affecting patient's functional outcome.   REHAB POTENTIAL: Fair    CLINICAL DECISION MAKING: Unstable/unpredictable  EVALUATION COMPLEXITY: High   GOALS: Goals reviewed with patient? No  SHORT TERM GOALS: Target date: 06/16/2022  Pt will be independent with HEP in order to improve strength and decrease back pain to improve pain-free function at home and work. Baseline:  Goal status: INITIAL   LONG TERM GOALS: Target date: 07/14/2022  Pt will increase FOTO to at least 50 to demonstrate significant improvement in function at home and work related to back pain  Baseline: 05/19/22: 44 Goal status: INITIAL  2.  Pt will decrease worst back pain by at least 2 points on  the NPRS in order to demonstrate clinically significant reduction in back pain. Baseline: 05/19/22: worst: 8/10; Goal status: INITIAL  3.  Pt will decrease mODI score by at least 13 points in order demonstrate clinically significant reduction in back pain/disability.       Baseline: 05/19/22: To be completed; 05/25/22: 56% Goal status: INITIAL  4.  Pt will report at least a 50% reduction in her back symptoms in order to allow her to walk extended distances so she can exercise without an excessive increase in her pain. Baseline:  Goal status: INITIAL   PLAN: PT FREQUENCY: 1-2x/week  PT DURATION: 8 weeks  PLANNED INTERVENTIONS: Therapeutic exercises,  Therapeutic activity, Neuromuscular re-education, Balance training, Gait training, Patient/Family education, Self Care, Joint mobilization, Joint manipulation, Vestibular training, Canalith repositioning, Orthotic/Fit training, DME instructions, Dry Needling, Electrical stimulation, Spinal manipulation, Spinal mobilization, Cryotherapy, Moist heat, Taping, Traction, Ultrasound, Ionotophoresis 4mg /ml Dexamethasone, Manual therapy, and Re-evaluation.  PLAN FOR NEXT SESSION:  Consider Readiness Ruler, review HEP and progress as appropriate. Progress strengthening;   Sharalyn Ink Daphna Lafuente PT, DPT, GCS  Idil Maslanka, PT 06/23/2022, 10:50 AM

## 2022-06-22 ENCOUNTER — Ambulatory Visit: Payer: Medicaid Other

## 2022-06-22 DIAGNOSIS — M5442 Lumbago with sciatica, left side: Secondary | ICD-10-CM | POA: Diagnosis not present

## 2022-06-22 DIAGNOSIS — G8929 Other chronic pain: Secondary | ICD-10-CM

## 2022-06-23 DIAGNOSIS — R052 Subacute cough: Secondary | ICD-10-CM | POA: Diagnosis not present

## 2022-06-23 DIAGNOSIS — R0600 Dyspnea, unspecified: Secondary | ICD-10-CM | POA: Diagnosis not present

## 2022-06-29 ENCOUNTER — Other Ambulatory Visit: Payer: Medicaid Other | Admitting: Licensed Clinical Social Worker

## 2022-06-29 NOTE — Patient Outreach (Signed)
Medicaid Managed Care Social Work Note  06/29/2022 Name:  Andrea Rocha MRN:  [id number] DOB:  11-29-1975  Andrea Rocha is an 47 y.o. year old female who is a primary patient of Patient, No Pcp Per.  The Medicaid Managed Care Coordination team was consulted for assistance with:  Mental Health Counseling and Resources  Andrea Rocha was given information about Medicaid Managed Care Coordination team services today. Andrea Rocha Patient agreed to services and verbal consent obtained.  Engaged with patient  for by telephone forfollow up visit in response to referral for case management and/or care coordination services.   Assessments/Interventions:  Review of past medical history, allergies, medications, health status, including review of consultants reports, laboratory and other test data, was performed as part of comprehensive evaluation and provision of chronic care management services.  SDOH: (Social Determinant of Health) assessments and interventions performed: SDOH Interventions    Flowsheet Row Patient Outreach Telephone from 06/29/2022 in [place] POPULATION HEALTH DEPARTMENT Patient Outreach Telephone from 06/15/2022 in [place] POPULATION HEALTH DEPARTMENT  SDOH Interventions    Stress Interventions Offered Community Wellness Resources Offered [employer], Provide Counseling       Advanced Directives Status:  See Care Plan for related entries.  Care Plan                 Not on File  Medications Reviewed Today     Reviewed by Andrea Rocha, PT (Physical Therapist) on 06/23/22 at 1047  Med List Status: <None>   Medication Order Taking? Sig Documenting Provider Last Dose Status Informant           No Medications to Display                            There are no problems to display for this patient.   Conditions to be addressed/monitored per PCP order:  Depression  Care Plan : LCSW Plan of Care  Updates made by Andrea Bryant,  LCSW since 06/29/2022 12:00 AM     Problem: Depression Identification (Depression)      Goal: Depressive Symptoms Identified   Note:   Priority: High  Timeframe:  Short-Range Goal Priority:  High Start Date:   06/15/22           Expected End Date:  06/29/22 goal has ended                 - keep 90 percent of scheduled appointments -consider counseling or psychiatry -consider bumping up your self-care  -consider creating a stronger support network   Why is this important?             Combatting depression may take some time.            If you don't feel better right away, don't give up on your treatment plan.    Current barriers:   Chronic Mental Health needs related to depression, PTSD and anxiety. Patient requires Support, Education, Resources, Referrals, Advocacy, and Care Coordination, in order to meet Unmet Mental Health Needs. Patient will implement clinical interventions discussed today to decrease symptoms of depression and increase knowledge and/or ability of: coping skills. Mental Health Concerns and Social Isolation Patient lacks knowledge of available community counseling agencies and resources.  Clinical Goal(s): verbalize understanding of plan for management of Anxiety, Depression, and PTSD and demonstrate a reduction in symptoms. Patient will connect with a provider for ongoing mental health  treatment, increase coping skills, healthy habits, self-management skills, and stress reduction        Clinical Interventions:  Assessed patient's previous and current treatment, coping skills, support system and barriers to care. Patient provided hx. Patient has been in therapy in and out since she as 47 years old. She reports not receiving therapy in the last 2-3 years and is interested in gaining mental health support.  Patient admits past trauma during childhood which contributes to her anxiety, stress and panic.  Patient has a strong support system including her spouse and two  adult sons. She reports that her husband recently started therapy.  Verbalization of feelings encouraged, motivational interviewing employed Emotional support provided, positive coping strategies explored. Establishing healthy boundaries emphasized and healthy self-care education provided Patient was educated on available mental health resources within her area that accept Medicaid and offer counseling and psychiatry. Patient is currently on the wait list for psychiatry at [facility] but she was informed by staff that the wait list is up to 6 months now and she would like to receive temporary mental health support until she can be established at [facility] as most of her care providers are at [facility].  Email sent to patient today with available mental health resources within her area that accept Medicaid and offer the services that she is interested in. Email included crisis support resources and Andrea Rocha's walk in clinic hours Patient is agreeable to reviewing these resources with husband  LCSW provided education on relaxation techniques such as meditation, deep breathing, massage, grounding exercises or yoga that can activate the body's relaxation response and ease symptoms of stress and anxiety. LCSW ask that when pt is struggling with difficult emotions and racing thoughts that they start this relaxation response process. LCSW provided extensive education on healthy coping skills for anxiety. SW used active and reflective listening, validated patient's feelings/concerns, and provided emotional support. Patient will work on implementing appropriate self-care habits into their daily routine such as: staying positive, writing a gratitude list, drinking water, staying active around the house, taking their medications as prescribed, combating negative thoughts or emotions and staying connected with their family and friends. Positive reinforcement provided for this decision to work on this. Patient reports being on 90 mg of  Cymbalta.  Patient was advised to exercise 10-20 minutes everyday, go outside in the sunlight everyday for at least 10-15 minutes, drink 6-8 glasses of water a day, take vitamins/medications as directed and try to increase sleep.   Motivational Interviewing employed Depression screen reviewed  PHQ2/ PHQ9 completed or reviewed  Mindfulness or Relaxation training provided Active listening / Reflection utilized  Advance Care and HCPOA education provided Emotional Support Provided Problem Solving /Task Center strategies reviewed Provided psychoeducation for mental health needs  Provided brief CBT  Reviewed mental health medications and discussed importance of compliance:  Quality of sleep assessed & Sleep Hygiene techniques promoted  Participation in counseling encouraged  Verbalization of feelings encouraged  Suicidal Ideation/Homicidal Ideation assessed: Patient denies SI/HI  Review resources, discussed options and provided patient information about  Mental Health Resources Inter-disciplinary care team collaboration (see longitudinal plan of care) 06/29/22 - Patient reports that she successfully received resources that [facility] LCSW emailed to her. She states that her mood/emotion management has improved and she will hold out until she is off the wait list for therapy at [facility]. She is agreeable to contact this [facility] LCSW if she wishes to gain a referral elsewhere. An additional email was sent to patient. Case closure  completed successfully. Patient agreeable to this plan.  Patient Goals/Self-Care Activities: Over the next 120 days Attend scheduled medical appointments Utilize healthy coping skills and supportive resources discussed Contact PCP with any questions or concerns Keep 90 percent of counseling appointments Call your insurance provider for more information about your Enhanced Benefits  Check out counseling resources provided  Begin personal counseling with LCSW, to reduce and manage  symptoms of Depression and Stress, until well-established with mental health provider Incorporate into daily practice - relaxation techniques, deep breathing exercises, and mindfulness meditation strategies. Talk about feelings with friends, family members, spiritual advisor, etc. Contact LCSW directly [phone]), if you have questions, need assistance, or if additional social work needs are identified between now and our next scheduled telephone outreach call. Call 988 for mental health hotline/crisis line if needed (24/7 available) Try techniques to reduce symptoms of anxiety/negative thinking (deep breathing, distraction, positive self talk, etc)  - develop a personal safety plan - develop a plan to deal with triggers like holidays, anniversaries - exercise at least 2 to 3 times per week - have a plan for how to handle bad days - journal feelings and what helps to feel better or worse - spend time or talk with others at least 2 to 3 times per week - watch for early signs of feeling worse - begin personal counseling - call and visit an old friend - check out volunteer opportunities - join a support group - laugh; watch a funny movie or comedian - learn and use visualization or guided imagery - perform a random act of kindness - practice relaxation or meditation daily - start or continue a personal journal - practice positive thinking and self-talk -continue with compliance of taking medication  -identify current effective and ineffective coping strategies.  -implement positive self-talk in care to increase self-esteem, confidence and feelings of control.  -consider alternative and complementary therapy approaches such as meditation, mindfulness or yoga.  -journaling, prayer, worship services, meditation or pastoral counseling.  -increase participation in pleasurable group activities such as hobbies, singing, sports or volunteering).  -consider the use of meditative movement  therapy such as tai chi, yoga or qigong.  -start a regular daily exercise program based on tolerance, ability and patient choice to support positive thinking and activity    If you are experiencing a Mental Health or Behavioral Health Crisis or need someone to talk to, please call the Suicide and Crisis Lifeline: 988      Follow up:  Patient requests no follow-up at this time.  Plan: The Managed Medicaid care management team is available to follow up with the patient after provider conversation with the patient regarding recommendation for care management engagement and subsequent re-referral to the care management team.   Dickie La, BSW, MSW, LCSW Managed Medicaid LCSW [facility]  [facility] [email or web address] Phone: [phone]

## 2022-06-29 NOTE — Patient Instructions (Signed)
Visit Information  Ms. Umana was given information about Medicaid Managed Care team care coordination services as a part of their [place] benefit. Andrea Rocha verbally consented to engagement with the [facility] Managed Care team.   If you are experiencing a medical emergency, please call 911 or report to your local emergency department or urgent care.   If you have a non-emergency medical problem during routine business hours, please contact your provider's office and ask to speak with a nurse.   For questions related to your [place] health plan, please call: [phone]  If you would like to schedule transportation through your [place] plan, please call the following number at least 2 days in advance of your appointment: [phone].   There is no limit to the number of trips during the year between medical appointments, healthcare facilities, or pharmacies. Transportation must be scheduled at least 2 business days before but not more than thirty 30 days before of your appointment.  Call the Behavioral Health Crisis Line at [phone], at any time, 24 hours a day, 7 days a week. If you are in danger or need immediate medical attention call 911.  If you would like help to quit smoking, call 1-800-QUIT-NOW (7863799124) OR Espaol: 1-855-Djelo-Ya (7-425-956-3875) o para ms informacin haga clic aqu or Text READY to 643-329 to register via text  Following is a copy of your plan of care:  Care Plan : LCSW Plan of Care  Updates made by Gustavus Bryant, LCSW since 06/29/2022 12:00 AM     Problem: Depression Identification (Depression)      Goal: Depressive Symptoms Identified   Note:   Priority: High  Timeframe:  Short-Range Goal Priority:  High Start Date:   06/15/22           Expected End Date:  06/29/22 goal has ended                 - keep 90 percent of scheduled appointments -consider counseling or  psychiatry -consider bumping up your self-care  -consider creating a stronger support network   Why is this important?             Combatting depression may take some time.            If you don't feel better right away, don't give up on your treatment plan.    Current barriers:   Chronic Mental Health needs related to depression, PTSD and anxiety. Patient requires Support, Education, Resources, Referrals, Advocacy, and Care Coordination, in order to meet Unmet Mental Health Needs. Patient will implement clinical interventions discussed today to decrease symptoms of depression and increase knowledge and/or ability of: coping skills. Mental Health Concerns and Social Isolation Patient lacks knowledge of available community counseling agencies and resources.  Clinical Goal(s): verbalize understanding of plan for management of Anxiety, Depression, and PTSD and demonstrate a reduction in symptoms. Patient will connect with a provider for ongoing mental health treatment, increase coping skills, healthy habits, self-management skills, and stress reduction        Patient Goals/Self-Care Activities: Over the next 120 days Attend scheduled medical appointments Utilize healthy coping skills and supportive resources discussed Contact PCP with any questions or concerns Keep 90 percent of counseling appointments Call your insurance provider for more information about your Enhanced Benefits  Check out counseling resources provided  Begin personal counseling with LCSW, to reduce and manage symptoms of Depression and Stress, until well-established with mental health provider Incorporate  into daily practice - relaxation techniques, deep breathing exercises, and mindfulness meditation strategies. Talk about feelings with friends, family members, spiritual advisor, etc. Contact LCSW directly [phone]), if you have questions, need assistance, or if additional social work needs are identified between now  and our next scheduled telephone outreach call. Call 988 for mental health hotline/crisis line if needed (24/7 available) Try techniques to reduce symptoms of anxiety/negative thinking (deep breathing, distraction, positive self talk, etc)  - develop a personal safety plan - develop a plan to deal with triggers like holidays, anniversaries - exercise at least 2 to 3 times per week - have a plan for how to handle bad days - journal feelings and what helps to feel better or worse - spend time or talk with others at least 2 to 3 times per week - watch for early signs of feeling worse - begin personal counseling - call and visit an old friend - check out volunteer opportunities - join a support group - laugh; watch a funny movie or comedian - learn and use visualization or guided imagery - perform a random act of kindness - practice relaxation or meditation daily - start or continue a personal journal - practice positive thinking and self-talk -continue with compliance of taking medication  -identify current effective and ineffective coping strategies.  -implement positive self-talk in care to increase self-esteem, confidence and feelings of control.  -consider alternative and complementary therapy approaches such as meditation, mindfulness or yoga.  -journaling, prayer, worship services, meditation or pastoral counseling.  -increase participation in pleasurable group activities such as hobbies, singing, sports or volunteering).  -consider the use of meditative movement therapy such as tai chi, yoga or qigong.  -start a regular daily exercise program based on tolerance, ability and patient choice to support positive thinking and activity    If you are experiencing a Mental Health or Behavioral Health Crisis or need someone to talk to, please call the Suicide and Crisis Lifeline: 988         24- Hour Availability:    [facility]  [address] [place],  [place] [phone] Crisis [phone]   [facility] [phone]  [place] [facility]  [phone]    [facility]  [phone] (after hours)   Therapeutic Alternative/Mobile Crisis   [phone]   [place] National Suicide Hotline  504-168-0969 Len Child[place]) [place] 841   Call 715-139-2764 for mental health emergencies   [facility]  [phone]);  [place] and [employer]  [phone]); [place], [place], [place], [place], [place], [place], [place]    [facility] Residents For 24/7 walk-up access to mental health services for [facility] children (4+), adolescents and adults, please visit the [facility] located at [address] in [place], [place].  *[facility] also provides comprehensive outpatient behavioral health services in a variety of locations around the [place].  Connect With [place] [address] [place], [place] [address] HelpLine: 229-308-3236 or 1-(864)882-8928  Get Directions  Find Help 24/7 By Phone Call our 24-hour HelpLine at (616)466-3597 or (813) 400-0041 for immediate assistance for mental health and substance abuse issues.  Walk-In Help [place]: [facility] (Ages 4 and Up) [place]: Emergency Dept., [facility] Additional Resources National Hopeline Network: 1-800-SUICIDE The National Suicide Prevention Lifeline: 1-800-273-TALK     10 LITTLE Things To Do When You're Feeling  Too Down To Do Anything  Take a shower. Even if you plan to stay in all day long and not see a soul, take a shower. It takes the most effort to hop in to the shower but once you do, you'll feel immediate results. It will wake you up and you'll be feeling much fresher (and cleaner too).  Brush and floss your teeth. Give your teeth a good brushing with a floss finish.  It's a small task but it feels so good and you can check 'taking care of your health' off the list of things to do.  Do something small on your list. Most of [place] have some small thing we would like to get done (load of laundry, sew a button, email a friend). Doing one of these things will make you feel like you've accomplished something.  Drink water. Drinking water is easy right? It's also really beneficial for your health so keep a glass beside you all day and take sips often. It gives you energy and prevents you from boredom eating.  Do some floor exercises. The last thing you want to do is exercise but it might be just the thing you need the most. Keep it simple and do exercises that involve sitting or laying on the floor. Even the smallest of exercises release chemicals in the brain that make you feel good. Yoga stretches or core exercises are going to make you feel good with minimal effort.  Make your bed. Making your bed takes a few minutes but it's productive and you'll feel relieved when it's done. An unmade bed is a huge visual reminder that you're having an unproductive day. Do it and consider it your housework for the day.  Put on some nice clothes. Take the sweatpants off even if you don't plan to go anywhere. Put on clothes that make you feel good. Take a look in the mirror so your brain recognizes the sweatpants have been replaced with clothes that make you look great. It's an instant confidence booster.  Wash the dishes. A pile of dirty dishes in the sink is a reflection of your mood. It's possible that if you wash up the dishes, your mood will follow suit. It's worth a try.  Cook a real meal. If you have the luxury to have a "do nothing" day, you have time to make a real meal for yourself. Make a meal that you love to eat. The process is good to get you out of the funk and the food will ensure you have more energy for tomorrow.  Write out your thoughts by hand. When you hand  write, you stimulate your brain to focus on the moment that you're in so make yourself comfortable and write whatever comes into your mind. Put those thoughts out on paper so they stop spinning around in your head. Those thoughts might be the very thing holding you down.  Dickie La, BSW, MSW, [employer] Managed Medicaid LCSW [facility]  [facility] [email or web address] Phone: [phone]

## 2022-06-30 ENCOUNTER — Ambulatory Visit: Payer: Medicaid Other

## 2022-06-30 DIAGNOSIS — G8929 Other chronic pain: Secondary | ICD-10-CM

## 2022-07-05 DIAGNOSIS — M5416 Radiculopathy, lumbar region: Secondary | ICD-10-CM | POA: Diagnosis not present

## 2022-07-05 DIAGNOSIS — Z6841 Body Mass Index (BMI) 40.0 and over, adult: Secondary | ICD-10-CM | POA: Diagnosis not present

## 2022-07-08 ENCOUNTER — Other Ambulatory Visit: Payer: Self-pay | Admitting: Neurological Surgery

## 2022-07-08 DIAGNOSIS — M5416 Radiculopathy, lumbar region: Secondary | ICD-10-CM

## 2022-07-13 ENCOUNTER — Ambulatory Visit
Admission: RE | Admit: 2022-07-13 | Discharge: 2022-07-13 | Disposition: A | Discharge status: Home / Self Care | Payer: Medicaid Other | Source: Ambulatory Visit | Attending: Neurological Surgery | Admitting: Neurological Surgery

## 2022-07-13 DIAGNOSIS — M4807 Spinal stenosis, lumbosacral region: Secondary | ICD-10-CM | POA: Diagnosis not present

## 2022-07-13 DIAGNOSIS — M5115 Intervertebral disc disorders with radiculopathy, thoracolumbar region: Secondary | ICD-10-CM | POA: Diagnosis not present

## 2022-07-13 DIAGNOSIS — M5416 Radiculopathy, lumbar region: Secondary | ICD-10-CM | POA: Diagnosis not present

## 2022-07-13 DIAGNOSIS — M4726 Other spondylosis with radiculopathy, lumbar region: Secondary | ICD-10-CM | POA: Diagnosis not present

## 2022-07-13 DIAGNOSIS — M48061 Spinal stenosis, lumbar region without neurogenic claudication: Secondary | ICD-10-CM | POA: Diagnosis not present

## 2022-07-26 DIAGNOSIS — M5416 Radiculopathy, lumbar region: Secondary | ICD-10-CM | POA: Diagnosis not present

## 2022-07-26 DIAGNOSIS — Z6841 Body Mass Index (BMI) 40.0 and over, adult: Secondary | ICD-10-CM | POA: Diagnosis not present

## 2022-08-12 DIAGNOSIS — H52223 Regular astigmatism, bilateral: Secondary | ICD-10-CM | POA: Diagnosis not present

## 2022-08-19 DIAGNOSIS — R809 Proteinuria, unspecified: Secondary | ICD-10-CM | POA: Diagnosis not present

## 2022-08-19 DIAGNOSIS — F321 Major depressive disorder, single episode, moderate: Secondary | ICD-10-CM | POA: Diagnosis not present

## 2022-08-19 DIAGNOSIS — J4540 Moderate persistent asthma, uncomplicated: Secondary | ICD-10-CM | POA: Diagnosis not present

## 2022-08-19 DIAGNOSIS — Z1211 Encounter for screening for malignant neoplasm of colon: Secondary | ICD-10-CM | POA: Diagnosis not present

## 2022-08-19 DIAGNOSIS — R052 Subacute cough: Secondary | ICD-10-CM | POA: Diagnosis not present

## 2022-08-19 DIAGNOSIS — R0600 Dyspnea, unspecified: Secondary | ICD-10-CM | POA: Diagnosis not present

## 2022-08-19 DIAGNOSIS — M545 Low back pain, unspecified: Secondary | ICD-10-CM | POA: Diagnosis not present

## 2022-08-19 DIAGNOSIS — G4733 Obstructive sleep apnea (adult) (pediatric): Secondary | ICD-10-CM | POA: Diagnosis not present

## 2022-08-19 DIAGNOSIS — E1129 Type 2 diabetes mellitus with other diabetic kidney complication: Secondary | ICD-10-CM | POA: Diagnosis not present

## 2022-08-31 DIAGNOSIS — M5416 Radiculopathy, lumbar region: Secondary | ICD-10-CM | POA: Diagnosis not present

## 2022-09-29 DIAGNOSIS — F321 Major depressive disorder, single episode, moderate: Secondary | ICD-10-CM | POA: Diagnosis not present

## 2022-09-30 DIAGNOSIS — F321 Major depressive disorder, single episode, moderate: Secondary | ICD-10-CM | POA: Diagnosis not present

## 2022-10-12 DIAGNOSIS — F321 Major depressive disorder, single episode, moderate: Secondary | ICD-10-CM | POA: Diagnosis not present

## 2022-10-24 DIAGNOSIS — F321 Major depressive disorder, single episode, moderate: Secondary | ICD-10-CM | POA: Diagnosis not present

## 2022-11-07 DIAGNOSIS — F321 Major depressive disorder, single episode, moderate: Secondary | ICD-10-CM | POA: Diagnosis not present

## 2022-11-18 DIAGNOSIS — R809 Proteinuria, unspecified: Secondary | ICD-10-CM | POA: Diagnosis not present

## 2022-11-18 DIAGNOSIS — K219 Gastro-esophageal reflux disease without esophagitis: Secondary | ICD-10-CM | POA: Diagnosis not present

## 2022-11-18 DIAGNOSIS — J309 Allergic rhinitis, unspecified: Secondary | ICD-10-CM | POA: Diagnosis not present

## 2022-11-18 DIAGNOSIS — E1129 Type 2 diabetes mellitus with other diabetic kidney complication: Secondary | ICD-10-CM | POA: Diagnosis not present

## 2022-11-18 DIAGNOSIS — E782 Mixed hyperlipidemia: Secondary | ICD-10-CM | POA: Diagnosis not present

## 2022-11-18 DIAGNOSIS — M545 Low back pain, unspecified: Secondary | ICD-10-CM | POA: Diagnosis not present

## 2022-11-18 DIAGNOSIS — R059 Cough, unspecified: Secondary | ICD-10-CM | POA: Diagnosis not present

## 2022-11-18 DIAGNOSIS — Z23 Encounter for immunization: Secondary | ICD-10-CM | POA: Diagnosis not present

## 2022-11-18 DIAGNOSIS — E119 Type 2 diabetes mellitus without complications: Secondary | ICD-10-CM | POA: Diagnosis not present

## 2022-11-30 DIAGNOSIS — M48061 Spinal stenosis, lumbar region without neurogenic claudication: Secondary | ICD-10-CM | POA: Diagnosis not present

## 2022-11-30 DIAGNOSIS — M47816 Spondylosis without myelopathy or radiculopathy, lumbar region: Secondary | ICD-10-CM | POA: Diagnosis not present

## 2022-11-30 DIAGNOSIS — Z79899 Other long term (current) drug therapy: Secondary | ICD-10-CM | POA: Diagnosis not present

## 2022-11-30 DIAGNOSIS — F17210 Nicotine dependence, cigarettes, uncomplicated: Secondary | ICD-10-CM | POA: Diagnosis not present

## 2022-11-30 DIAGNOSIS — M545 Low back pain, unspecified: Secondary | ICD-10-CM | POA: Diagnosis not present

## 2022-11-30 DIAGNOSIS — I10 Essential (primary) hypertension: Secondary | ICD-10-CM | POA: Diagnosis not present

## 2022-11-30 DIAGNOSIS — Z5941 Food insecurity: Secondary | ICD-10-CM | POA: Diagnosis not present

## 2022-11-30 DIAGNOSIS — E785 Hyperlipidemia, unspecified: Secondary | ICD-10-CM | POA: Diagnosis not present

## 2022-11-30 DIAGNOSIS — E119 Type 2 diabetes mellitus without complications: Secondary | ICD-10-CM | POA: Diagnosis not present

## 2022-12-01 DIAGNOSIS — F321 Major depressive disorder, single episode, moderate: Secondary | ICD-10-CM | POA: Diagnosis not present

## 2022-12-30 DIAGNOSIS — G4733 Obstructive sleep apnea (adult) (pediatric): Secondary | ICD-10-CM | POA: Diagnosis not present

## 2023-01-20 DIAGNOSIS — M545 Low back pain, unspecified: Secondary | ICD-10-CM | POA: Diagnosis not present

## 2023-01-20 DIAGNOSIS — M5116 Intervertebral disc disorders with radiculopathy, lumbar region: Secondary | ICD-10-CM | POA: Diagnosis not present

## 2023-01-20 DIAGNOSIS — M5416 Radiculopathy, lumbar region: Secondary | ICD-10-CM | POA: Diagnosis not present

## 2023-01-20 DIAGNOSIS — M5126 Other intervertebral disc displacement, lumbar region: Secondary | ICD-10-CM | POA: Diagnosis not present

## 2023-02-06 DIAGNOSIS — M47816 Spondylosis without myelopathy or radiculopathy, lumbar region: Secondary | ICD-10-CM | POA: Diagnosis not present

## 2023-02-06 DIAGNOSIS — M4724 Other spondylosis with radiculopathy, thoracic region: Secondary | ICD-10-CM | POA: Diagnosis not present

## 2023-02-09 DIAGNOSIS — F4542 Pain disorder with related psychological factors: Secondary | ICD-10-CM | POA: Diagnosis not present

## 2023-02-22 DIAGNOSIS — F419 Anxiety disorder, unspecified: Secondary | ICD-10-CM | POA: Diagnosis not present

## 2023-02-22 DIAGNOSIS — Z5181 Encounter for therapeutic drug level monitoring: Secondary | ICD-10-CM | POA: Diagnosis not present

## 2023-02-22 DIAGNOSIS — R61 Generalized hyperhidrosis: Secondary | ICD-10-CM | POA: Diagnosis not present

## 2023-02-22 DIAGNOSIS — E119 Type 2 diabetes mellitus without complications: Secondary | ICD-10-CM | POA: Diagnosis not present

## 2023-02-22 DIAGNOSIS — E782 Mixed hyperlipidemia: Secondary | ICD-10-CM | POA: Diagnosis not present

## 2023-02-22 DIAGNOSIS — G4733 Obstructive sleep apnea (adult) (pediatric): Secondary | ICD-10-CM | POA: Diagnosis not present

## 2023-02-27 DIAGNOSIS — J4520 Mild intermittent asthma, uncomplicated: Secondary | ICD-10-CM | POA: Diagnosis not present

## 2023-02-27 DIAGNOSIS — K635 Polyp of colon: Secondary | ICD-10-CM | POA: Diagnosis not present

## 2023-02-27 DIAGNOSIS — E785 Hyperlipidemia, unspecified: Secondary | ICD-10-CM | POA: Diagnosis not present

## 2023-02-27 DIAGNOSIS — F4310 Post-traumatic stress disorder, unspecified: Secondary | ICD-10-CM | POA: Diagnosis not present

## 2023-02-27 DIAGNOSIS — K621 Rectal polyp: Secondary | ICD-10-CM | POA: Diagnosis not present

## 2023-02-27 DIAGNOSIS — Z7984 Long term (current) use of oral hypoglycemic drugs: Secondary | ICD-10-CM | POA: Diagnosis not present

## 2023-02-27 DIAGNOSIS — I10 Essential (primary) hypertension: Secondary | ICD-10-CM | POA: Diagnosis not present

## 2023-02-27 DIAGNOSIS — Z1211 Encounter for screening for malignant neoplasm of colon: Secondary | ICD-10-CM | POA: Diagnosis not present

## 2023-02-27 DIAGNOSIS — E119 Type 2 diabetes mellitus without complications: Secondary | ICD-10-CM | POA: Diagnosis not present

## 2023-02-27 DIAGNOSIS — Z794 Long term (current) use of insulin: Secondary | ICD-10-CM | POA: Diagnosis not present

## 2023-02-27 DIAGNOSIS — K5730 Diverticulosis of large intestine without perforation or abscess without bleeding: Secondary | ICD-10-CM | POA: Diagnosis not present

## 2023-02-27 DIAGNOSIS — Z79899 Other long term (current) drug therapy: Secondary | ICD-10-CM | POA: Diagnosis not present

## 2023-02-27 DIAGNOSIS — D122 Benign neoplasm of ascending colon: Secondary | ICD-10-CM | POA: Diagnosis not present

## 2023-03-16 DIAGNOSIS — Z01818 Encounter for other preprocedural examination: Secondary | ICD-10-CM | POA: Diagnosis not present

## 2023-03-16 DIAGNOSIS — E669 Obesity, unspecified: Secondary | ICD-10-CM | POA: Diagnosis not present

## 2023-03-16 DIAGNOSIS — J45909 Unspecified asthma, uncomplicated: Secondary | ICD-10-CM | POA: Diagnosis not present

## 2023-03-16 DIAGNOSIS — G4733 Obstructive sleep apnea (adult) (pediatric): Secondary | ICD-10-CM | POA: Diagnosis not present

## 2023-03-23 DIAGNOSIS — M5126 Other intervertebral disc displacement, lumbar region: Secondary | ICD-10-CM | POA: Diagnosis not present

## 2023-03-23 DIAGNOSIS — Z6841 Body Mass Index (BMI) 40.0 and over, adult: Secondary | ICD-10-CM | POA: Diagnosis not present

## 2023-03-23 DIAGNOSIS — M549 Dorsalgia, unspecified: Secondary | ICD-10-CM | POA: Diagnosis not present

## 2023-03-23 DIAGNOSIS — I10 Essential (primary) hypertension: Secondary | ICD-10-CM | POA: Diagnosis not present

## 2023-03-23 DIAGNOSIS — K219 Gastro-esophageal reflux disease without esophagitis: Secondary | ICD-10-CM | POA: Diagnosis not present

## 2023-03-23 DIAGNOSIS — Z90710 Acquired absence of both cervix and uterus: Secondary | ICD-10-CM | POA: Diagnosis not present

## 2023-03-23 DIAGNOSIS — E785 Hyperlipidemia, unspecified: Secondary | ICD-10-CM | POA: Diagnosis not present

## 2023-03-23 DIAGNOSIS — Z7951 Long term (current) use of inhaled steroids: Secondary | ICD-10-CM | POA: Diagnosis not present

## 2023-03-23 DIAGNOSIS — E119 Type 2 diabetes mellitus without complications: Secondary | ICD-10-CM | POA: Diagnosis not present

## 2023-03-23 DIAGNOSIS — Z87891 Personal history of nicotine dependence: Secondary | ICD-10-CM | POA: Diagnosis not present

## 2023-03-23 DIAGNOSIS — M5116 Intervertebral disc disorders with radiculopathy, lumbar region: Secondary | ICD-10-CM | POA: Diagnosis not present

## 2023-03-23 DIAGNOSIS — E669 Obesity, unspecified: Secondary | ICD-10-CM | POA: Diagnosis not present

## 2023-03-23 DIAGNOSIS — Z7984 Long term (current) use of oral hypoglycemic drugs: Secondary | ICD-10-CM | POA: Diagnosis not present

## 2023-03-23 DIAGNOSIS — G8929 Other chronic pain: Secondary | ICD-10-CM | POA: Diagnosis not present

## 2023-03-23 DIAGNOSIS — G4733 Obstructive sleep apnea (adult) (pediatric): Secondary | ICD-10-CM | POA: Diagnosis not present

## 2023-03-23 DIAGNOSIS — M5416 Radiculopathy, lumbar region: Secondary | ICD-10-CM | POA: Diagnosis not present

## 2023-03-23 DIAGNOSIS — Z79899 Other long term (current) drug therapy: Secondary | ICD-10-CM | POA: Diagnosis not present

## 2023-03-28 DIAGNOSIS — M5416 Radiculopathy, lumbar region: Secondary | ICD-10-CM | POA: Diagnosis not present

## 2023-03-28 DIAGNOSIS — Z09 Encounter for follow-up examination after completed treatment for conditions other than malignant neoplasm: Secondary | ICD-10-CM | POA: Diagnosis not present

## 2023-05-05 DIAGNOSIS — J4520 Mild intermittent asthma, uncomplicated: Secondary | ICD-10-CM | POA: Diagnosis not present

## 2023-05-05 DIAGNOSIS — G8929 Other chronic pain: Secondary | ICD-10-CM | POA: Diagnosis not present

## 2023-05-05 DIAGNOSIS — G4730 Sleep apnea, unspecified: Secondary | ICD-10-CM | POA: Diagnosis not present

## 2023-05-05 DIAGNOSIS — E785 Hyperlipidemia, unspecified: Secondary | ICD-10-CM | POA: Diagnosis not present

## 2023-05-05 DIAGNOSIS — M5416 Radiculopathy, lumbar region: Secondary | ICD-10-CM | POA: Diagnosis not present

## 2023-05-05 DIAGNOSIS — M5116 Intervertebral disc disorders with radiculopathy, lumbar region: Secondary | ICD-10-CM | POA: Diagnosis not present

## 2023-05-05 DIAGNOSIS — Z6841 Body Mass Index (BMI) 40.0 and over, adult: Secondary | ICD-10-CM | POA: Diagnosis not present

## 2023-05-05 DIAGNOSIS — F4310 Post-traumatic stress disorder, unspecified: Secondary | ICD-10-CM | POA: Diagnosis not present

## 2023-05-05 DIAGNOSIS — Z7984 Long term (current) use of oral hypoglycemic drugs: Secondary | ICD-10-CM | POA: Diagnosis not present

## 2023-05-05 DIAGNOSIS — Z7983 Long term (current) use of bisphosphonates: Secondary | ICD-10-CM | POA: Diagnosis not present

## 2023-05-05 DIAGNOSIS — Z90710 Acquired absence of both cervix and uterus: Secondary | ICD-10-CM | POA: Diagnosis not present

## 2023-05-05 DIAGNOSIS — E66813 Obesity, class 3: Secondary | ICD-10-CM | POA: Diagnosis not present

## 2023-05-05 DIAGNOSIS — Z79899 Other long term (current) drug therapy: Secondary | ICD-10-CM | POA: Diagnosis not present

## 2023-05-05 DIAGNOSIS — Z7951 Long term (current) use of inhaled steroids: Secondary | ICD-10-CM | POA: Diagnosis not present

## 2023-05-05 DIAGNOSIS — E119 Type 2 diabetes mellitus without complications: Secondary | ICD-10-CM | POA: Diagnosis not present

## 2023-05-19 DIAGNOSIS — M5416 Radiculopathy, lumbar region: Secondary | ICD-10-CM | POA: Diagnosis not present

## 2023-05-19 DIAGNOSIS — Z4889 Encounter for other specified surgical aftercare: Secondary | ICD-10-CM | POA: Diagnosis not present

## 2023-05-19 DIAGNOSIS — Z9889 Other specified postprocedural states: Secondary | ICD-10-CM | POA: Diagnosis not present

## 2023-05-19 DIAGNOSIS — Z9682 Presence of neurostimulator: Secondary | ICD-10-CM | POA: Diagnosis not present

## 2023-05-19 DIAGNOSIS — M4726 Other spondylosis with radiculopathy, lumbar region: Secondary | ICD-10-CM | POA: Diagnosis not present

## 2023-06-08 DIAGNOSIS — R55 Syncope and collapse: Secondary | ICD-10-CM | POA: Diagnosis not present

## 2023-06-17 DIAGNOSIS — R55 Syncope and collapse: Secondary | ICD-10-CM | POA: Diagnosis not present

## 2023-06-23 DIAGNOSIS — G4733 Obstructive sleep apnea (adult) (pediatric): Secondary | ICD-10-CM | POA: Diagnosis not present

## 2023-06-30 DIAGNOSIS — Z9689 Presence of other specified functional implants: Secondary | ICD-10-CM | POA: Diagnosis not present

## 2023-06-30 DIAGNOSIS — M5416 Radiculopathy, lumbar region: Secondary | ICD-10-CM | POA: Diagnosis not present

## 2023-07-17 DIAGNOSIS — R55 Syncope and collapse: Secondary | ICD-10-CM | POA: Diagnosis not present

## 2023-07-21 DIAGNOSIS — I10 Essential (primary) hypertension: Secondary | ICD-10-CM | POA: Diagnosis not present

## 2023-07-21 DIAGNOSIS — G4733 Obstructive sleep apnea (adult) (pediatric): Secondary | ICD-10-CM | POA: Diagnosis not present

## 2023-07-21 DIAGNOSIS — E119 Type 2 diabetes mellitus without complications: Secondary | ICD-10-CM | POA: Diagnosis not present

## 2023-07-21 DIAGNOSIS — F419 Anxiety disorder, unspecified: Secondary | ICD-10-CM | POA: Diagnosis not present

## 2023-07-21 DIAGNOSIS — E782 Mixed hyperlipidemia: Secondary | ICD-10-CM | POA: Diagnosis not present

## 2023-07-21 DIAGNOSIS — F329 Major depressive disorder, single episode, unspecified: Secondary | ICD-10-CM | POA: Diagnosis not present

## 2023-07-21 DIAGNOSIS — J4540 Moderate persistent asthma, uncomplicated: Secondary | ICD-10-CM | POA: Diagnosis not present

## 2023-07-21 DIAGNOSIS — Z6841 Body Mass Index (BMI) 40.0 and over, adult: Secondary | ICD-10-CM | POA: Diagnosis not present

## 2023-07-21 DIAGNOSIS — M545 Low back pain, unspecified: Secondary | ICD-10-CM | POA: Diagnosis not present

## 2023-07-21 DIAGNOSIS — E669 Obesity, unspecified: Secondary | ICD-10-CM | POA: Diagnosis not present

## 2023-07-28 DIAGNOSIS — Z1231 Encounter for screening mammogram for malignant neoplasm of breast: Secondary | ICD-10-CM | POA: Diagnosis not present

## 2023-08-14 DIAGNOSIS — E119 Type 2 diabetes mellitus without complications: Secondary | ICD-10-CM | POA: Diagnosis not present

## 2023-08-14 DIAGNOSIS — H35039 Hypertensive retinopathy, unspecified eye: Secondary | ICD-10-CM | POA: Diagnosis not present

## 2023-08-15 DIAGNOSIS — H5213 Myopia, bilateral: Secondary | ICD-10-CM | POA: Diagnosis not present

## 2023-10-02 DIAGNOSIS — M5416 Radiculopathy, lumbar region: Secondary | ICD-10-CM | POA: Diagnosis not present

## 2023-10-02 DIAGNOSIS — Z9689 Presence of other specified functional implants: Secondary | ICD-10-CM | POA: Diagnosis not present

## 2023-10-03 DIAGNOSIS — H524 Presbyopia: Secondary | ICD-10-CM | POA: Diagnosis not present

## 2023-10-16 DIAGNOSIS — Z630 Problems in relationship with spouse or partner: Secondary | ICD-10-CM | POA: Diagnosis not present

## 2023-10-16 DIAGNOSIS — F4310 Post-traumatic stress disorder, unspecified: Secondary | ICD-10-CM | POA: Diagnosis not present

## 2023-12-01 DIAGNOSIS — G4733 Obstructive sleep apnea (adult) (pediatric): Secondary | ICD-10-CM | POA: Diagnosis not present

## 2023-12-01 DIAGNOSIS — F329 Major depressive disorder, single episode, unspecified: Secondary | ICD-10-CM | POA: Diagnosis not present

## 2023-12-01 DIAGNOSIS — J4540 Moderate persistent asthma, uncomplicated: Secondary | ICD-10-CM | POA: Diagnosis not present

## 2023-12-01 DIAGNOSIS — E669 Obesity, unspecified: Secondary | ICD-10-CM | POA: Diagnosis not present

## 2023-12-01 DIAGNOSIS — Z23 Encounter for immunization: Secondary | ICD-10-CM | POA: Diagnosis not present

## 2023-12-01 DIAGNOSIS — Z6841 Body Mass Index (BMI) 40.0 and over, adult: Secondary | ICD-10-CM | POA: Diagnosis not present

## 2023-12-01 DIAGNOSIS — H9191 Unspecified hearing loss, right ear: Secondary | ICD-10-CM | POA: Diagnosis not present

## 2023-12-01 DIAGNOSIS — M545 Low back pain, unspecified: Secondary | ICD-10-CM | POA: Diagnosis not present

## 2023-12-01 DIAGNOSIS — F419 Anxiety disorder, unspecified: Secondary | ICD-10-CM | POA: Diagnosis not present

## 2023-12-01 DIAGNOSIS — E119 Type 2 diabetes mellitus without complications: Secondary | ICD-10-CM | POA: Diagnosis not present

## 2023-12-01 DIAGNOSIS — E782 Mixed hyperlipidemia: Secondary | ICD-10-CM | POA: Diagnosis not present

## 2023-12-01 DIAGNOSIS — I10 Essential (primary) hypertension: Secondary | ICD-10-CM | POA: Diagnosis not present

## 2023-12-11 DIAGNOSIS — G4733 Obstructive sleep apnea (adult) (pediatric): Secondary | ICD-10-CM | POA: Diagnosis not present

## 2023-12-28 DIAGNOSIS — H903 Sensorineural hearing loss, bilateral: Secondary | ICD-10-CM | POA: Diagnosis not present
